# Patient Record
Sex: Female | Born: 1985 | Race: Black or African American | Hispanic: No | Marital: Single | Smoking: Current every day smoker
Health system: Southern US, Community
[De-identification: ages and names within clinical notes are randomized; demographics above are authoritative.]

## PROBLEM LIST (undated history)

## (undated) DIAGNOSIS — N750 Cyst of Bartholin's gland: Secondary | ICD-10-CM

## (undated) DIAGNOSIS — K219 Gastro-esophageal reflux disease without esophagitis: Secondary | ICD-10-CM

## (undated) HISTORY — PX: OTHER SURGICAL HISTORY: SHX169

## (undated) HISTORY — PX: CYSTECTOMY: SUR359

---
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
## 2006-12-13 ENCOUNTER — Ambulatory Visit (HOSPITAL_BASED_OUTPATIENT_CLINIC_OR_DEPARTMENT_OTHER): Admission: RE | Admit: 2006-12-13 | Discharge: 2006-12-13 | Payer: Self-pay | Admitting: Specialist

## 2006-12-13 ENCOUNTER — Encounter (INDEPENDENT_AMBULATORY_CARE_PROVIDER_SITE_OTHER): Payer: Self-pay | Admitting: Specialist

## 2009-02-12 ENCOUNTER — Emergency Department (HOSPITAL_COMMUNITY): Admission: EM | Admit: 2009-02-12 | Discharge: 2009-02-12 | Payer: Self-pay | Admitting: Emergency Medicine

## 2010-04-21 ENCOUNTER — Ambulatory Visit (HOSPITAL_BASED_OUTPATIENT_CLINIC_OR_DEPARTMENT_OTHER): Admission: RE | Admit: 2010-04-21 | Discharge: 2010-04-21 | Payer: Self-pay | Admitting: Specialist

## 2010-10-23 LAB — CBC
HCT: 34.2 % — ABNORMAL LOW (ref 36.0–46.0)
MCV: 87.7 fL (ref 78.0–100.0)
RBC: 3.9 MIL/uL (ref 3.87–5.11)
RDW: 13.7 % (ref 11.5–15.5)
WBC: 4.8 10*3/uL (ref 4.0–10.5)

## 2010-10-23 LAB — BASIC METABOLIC PANEL
BUN: 10 mg/dL (ref 6–23)
Chloride: 104 mEq/L (ref 96–112)
Potassium: 3.4 mEq/L — ABNORMAL LOW (ref 3.5–5.1)

## 2010-10-23 LAB — DIFFERENTIAL
Eosinophils Relative: 2 % (ref 0–5)
Lymphocytes Relative: 32 % (ref 12–46)
Lymphs Abs: 1.5 10*3/uL (ref 0.7–4.0)
Monocytes Absolute: 0.2 10*3/uL (ref 0.1–1.0)

## 2010-10-27 LAB — CBC: HCT: 37 % (ref 36–46)

## 2010-10-27 LAB — HIV ANTIBODY (ROUTINE TESTING W REFLEX): HIV: NONREACTIVE

## 2010-10-27 LAB — ABO/RH

## 2010-10-27 LAB — RUBELLA ANTIBODY, IGM: Rubella: IMMUNE

## 2010-12-26 NOTE — Op Note (Signed)
Nancy Swanson, Nancy Swanson                   ACCOUNT NO.:  [id number]   MEDICAL RECORD NO.:  [id number]          PATIENT TYPE:  AMB   LOCATION:  DSC                          FACILITY:  [facility]   PHYSICIAN:  Earvin Hansen L. Truesdale, M.D.DATE OF BIRTH:  1985-11-22   DATE OF PROCEDURE:  12/13/2006  DATE OF DISCHARGE:                               OPERATIVE REPORT   This is a 25 year old with a history of severe hidradenitis suppurativa  involving her left axillary area, had multiple I&Ds and conservative  treatment which failed.   PROCEDURES/PLAN:  Wide excision of this disorder, with closure with Estill Batten closure for flaps.   ANESTHESIA:  General.   The patient underwent general anesthesia, was intubated orally.  Prep  was done to the left axillary, arm, and chest areas with Betadine soap  and solution, and walled off with sterile towels and drapes so as to  make a sterile field.  Half-percent Xylocaine with epinephrine was  injected locally 1:200,000 concentration, a total of 50 mL.  Excision  was made of the area on an elliptical drawing previously done  preoperatively down to underlying superficial fascia.  After proper  hemostasis, the dissection was carried off en bloc to remove all of  devitalized tissue.  After proper hemostasis, the flaps were then  rotated and stayed with 2-0 Monocryl deep, subcutaneously and to fascia  x2 layers, a subdermal suture of 2-0 Monocryl, and then I ran a  subcuticular stitch of 3-0 Monocryl.  This was reinforced with a running  3-0 nylon.  Steri-Strips and soft dressing was applied to all the areas.  She withstood the procedure very well.   ESTIMATED BLOOD LOSS:  Less than 50 mL.   COMPLICATIONS:  None.      Nancy Swanson. Nancy Swanson, M.D.  Electronically Signed     GLT/MEDQ  D:  12/13/2006  T:  12/13/2006  Job:  [id number]

## 2010-12-26 NOTE — H&P (Signed)
[facility]  Patient:    Nancy Swanson, MEAS Visit Number: [id number] MRN: [id number]          Service Type: OBS Location: 910A 9130 01 Attending Physician:  Venita Sheffield Dictated by:   Kathreen Cosier, M.D. Admit Date:  06/13/2001                           History and Physical  HISTORY OF PRESENT ILLNESS:   Patient is a 25 year old primigravida, [facility] June 17, 2001, positive GBS.  She was admitted on the morning of November 4 with ruptured membranes which occurred spontaneously at 7 a.m.; fluid was clear.  Cervix was 1 cm, 90%, with the vertex -2 to -3.  She had been treated with ampicillin p.o. for positive GBS and on admission started a penicillin regimen.  The patient was allowed to go into labor, which she did not do, and she was started on Pitocin at 4 p.m. on November 4.  She had contractions two to five minutes apart of a mild nature.  Patient did not feel her contractions.  We went up on the Pitocin throughout the night and into the morning of November 5.  She was up to 32 milliunits of Pitocin, contracting every two to five minutes, very mild, the patient not feeling, no change in cervix.  It was decided she would be delivered by C section for prolonged ruptured membranes and failed induction of labor.  PHYSICAL EXAMINATION:  GENERAL:                      Revealed a well-developed female with ruptured membranes.  HEENT:                        Negative.  LUNGS:                        Clear.  HEART:                        Regular rhythm, no murmurs or gallops.  BREASTS:                      No masses.  ABDOMEN:                      Term.  Estimated fetal weight 7 pounds 6 ounces.  CERVIX:                       As described above.  EXTREMITIES:                  Negative. Dictated by:   Kathreen Cosier, M.D. Attending Physician:  Venita Sheffield DD:  06/14/01 TD:  06/14/01 Job: [id number]

## 2010-12-26 NOTE — Op Note (Signed)
[facility]  Patient:    Nancy Swanson, Nancy Swanson Visit Number: [id number] MRN: [id number]          Service Type: OBS Location: 910A 9130 01 Attending Physician:  Venita Sheffield Dictated by:   Kathreen Cosier, M.D. Proc. Date: 06/14/01 Admit Date:  06/13/2001                             Operative Report  PREOPERATIVE DIAGNOSES:       Prolonged ruptured membranes, failed induction of labor.  SURGEON:                      Kathreen Cosier, M.D.  ANESTHESIA:                   Spinal.  PROCEDURE:                    Patient placed on the operating table in supine position after spinal administered.  Abdomen prepped and draped.  Bladder emptied with Foley catheter.  A transverse suprapubic incision made, carried down to the rectus fascia.  Fascia cleaned and incised the length of the incision.  Recti muscles retracted laterally.  Perineum extended longitudinally.  Transverse incision made in the visceroperitoneum above the bladder.  Bladder mobilized inferior.  Transverse lower uterine incision made. The patient delivered from the LOT position of a female Apgar 9/9 weighing 7 pounds 3 ounces.  The team was in attendance and fluid was clear.  The placenta was posterior, removed manually.  Uterine cavity cleaned with dry laps.  Uterine incision closed in one layer with continuous suture of #1 Chromic.  The uterus was noted to be boggy and with massage and ______ 0.2 it firmed up.  Blood loss was about 800 cc.  The bladder flap reattached with 2-0 Chromic.  Uterus well contracted.  Tubes and ovaries normal.  Abdomen closed in layers.  Peritoneum continuous suture of 0 Chromic.  Fascia continuous suture of 0 Dexon.  Skin closed with subcuticular stitch of 0 plain.  Patient tolerated procedure well.  Taken to recovery room in good condition.Dictated by:   Kathreen Cosier, M.D. Attending Physician:  Venita Sheffield DD:  06/14/01 TD:   06/15/01 Job: [id number]

## 2010-12-26 NOTE — Discharge Summary (Signed)
[facility]  Patient:    Nancy Swanson, Nancy Swanson Visit Number: [id number] MRN: [id number]          Service Type: OBS Location: 910A 9130 01 Attending Physician:  Venita Sheffield Dictated by:   Kathreen Cosier, M.D. Admit Date:  06/13/2001 Discharge Date: 06/17/2001                             Discharge Summary  HISTORY OF PRESENT ILLNESS:   The patient is a 25 year old, primigravida, [facility] June 17, 2001.  She was admitted on June 13, 2001 with ruptured membranes, not in labor.  Cervix 1 cm, 90% vertex, -2, positive GBS treated with penicillin.  HOSPITAL COURSE:              The patient underwent Pitocin induction but did not go in labor and was delivered by a C-section having a female Apgar 9 of nine minutes, 7 pounds and 3 ounces.  On admission, her hemoglobin was 11.8 and postdelivery was 8.4  She was discharged home on the third postoperative day, ambulating, on a regular bath, and on Tylenol #3 for pain and Micronor for contraception and ferrous sulfate for anemia.  DISCHARGE DIAGNOSES:          1. Status post premature rupture at term.                               2. Failed induction of labor. Dictated by:   Kathreen Cosier, M.D. Attending Physician:  Venita Sheffield DD:  06/17/01 TD:  06/18/01 Job: [id number]

## 2010-12-28 ENCOUNTER — Inpatient Hospital Stay (HOSPITAL_COMMUNITY)
Admission: AD | Admit: 2010-12-28 | Discharge: 2010-12-28 | Disposition: A | Discharge status: Home / Self Care | Payer: Medicaid Other | Source: Ambulatory Visit | Attending: Obstetrics | Admitting: Obstetrics

## 2010-12-28 DIAGNOSIS — J329 Chronic sinusitis, unspecified: Secondary | ICD-10-CM | POA: Insufficient documentation

## 2010-12-28 DIAGNOSIS — O99891 Other specified diseases and conditions complicating pregnancy: Secondary | ICD-10-CM | POA: Insufficient documentation

## 2010-12-28 DIAGNOSIS — O9989 Other specified diseases and conditions complicating pregnancy, childbirth and the puerperium: Secondary | ICD-10-CM

## 2010-12-31 ENCOUNTER — Other Ambulatory Visit (HOSPITAL_COMMUNITY): Payer: Self-pay | Admitting: Obstetrics

## 2010-12-31 DIAGNOSIS — O2690 Pregnancy related conditions, unspecified, unspecified trimester: Secondary | ICD-10-CM

## 2011-01-12 ENCOUNTER — Ambulatory Visit (HOSPITAL_COMMUNITY)
Admission: RE | Admit: 2011-01-12 | Discharge: 2011-01-12 | Disposition: A | Discharge status: Home / Self Care | Payer: Medicaid Other | Source: Ambulatory Visit | Attending: Obstetrics | Admitting: Obstetrics

## 2011-01-12 DIAGNOSIS — Z1389 Encounter for screening for other disorder: Secondary | ICD-10-CM | POA: Insufficient documentation

## 2011-01-12 DIAGNOSIS — Z363 Encounter for antenatal screening for malformations: Secondary | ICD-10-CM | POA: Insufficient documentation

## 2011-01-12 DIAGNOSIS — O2690 Pregnancy related conditions, unspecified, unspecified trimester: Secondary | ICD-10-CM

## 2011-01-12 DIAGNOSIS — O34219 Maternal care for unspecified type scar from previous cesarean delivery: Secondary | ICD-10-CM | POA: Insufficient documentation

## 2011-01-12 DIAGNOSIS — O358XX0 Maternal care for other (suspected) fetal abnormality and damage, not applicable or unspecified: Secondary | ICD-10-CM | POA: Insufficient documentation

## 2011-04-11 ENCOUNTER — Encounter (HOSPITAL_COMMUNITY): Payer: Self-pay | Admitting: *Deleted

## 2011-04-11 ENCOUNTER — Inpatient Hospital Stay (HOSPITAL_COMMUNITY)
Admission: AD | Admit: 2011-04-11 | Discharge: 2011-04-14 | DRG: 781 | Disposition: A | Discharge status: Home / Self Care | Payer: Medicaid Other | Source: Ambulatory Visit | Attending: Obstetrics | Admitting: Obstetrics

## 2011-04-11 DIAGNOSIS — N762 Acute vulvitis: Secondary | ICD-10-CM

## 2011-04-11 DIAGNOSIS — N760 Acute vaginitis: Secondary | ICD-10-CM | POA: Diagnosis present

## 2011-04-11 DIAGNOSIS — Z3490 Encounter for supervision of normal pregnancy, unspecified, unspecified trimester: Secondary | ICD-10-CM

## 2011-04-11 DIAGNOSIS — O2390 Unspecified genitourinary tract infection in pregnancy, unspecified trimester: Principal | ICD-10-CM | POA: Diagnosis present

## 2011-04-11 NOTE — Progress Notes (Signed)
Pt states, " I had a cyst come up on my right labia on Wed-Thus, and I went to my clinic on Thurs. They said it wasn't ready to drain, and they put me on antibiotics. It has gotten worse since then."

## 2011-04-11 NOTE — Progress Notes (Signed)
G2P1 at 33.3wks. "boil" started on Weds and Thurs alittle worse. Called Dr Elsie Stain office and was told to call regular MD. Was seen at [facility] and was told not ready to lance. On Amoxicillin 250mg  TID since Friday. Pain has just gotten worse and more swollen.

## 2011-04-11 NOTE — ED Notes (Signed)
Terri Burrelson RNP at bedside, cyst examined.

## 2011-04-12 ENCOUNTER — Encounter (HOSPITAL_COMMUNITY): Payer: Self-pay | Admitting: *Deleted

## 2011-04-12 DIAGNOSIS — N762 Acute vulvitis: Secondary | ICD-10-CM

## 2011-04-12 DIAGNOSIS — Z3490 Encounter for supervision of normal pregnancy, unspecified, unspecified trimester: Secondary | ICD-10-CM

## 2011-04-12 MED ORDER — NICOTINE 14 MG/24HR TD PT24
14.0000 mg | MEDICATED_PATCH | Freq: Every day | TRANSDERMAL | Status: DC
  Administered 2011-04-12 – 2011-04-13 (×2): 14 mg via TRANSDERMAL
  Filled 2011-04-12 (×5): qty 1

## 2011-04-12 MED ORDER — LACTATED RINGERS IV SOLN
INTRAVENOUS | Status: DC
  Administered 2011-04-12 – 2011-04-14 (×6): via INTRAVENOUS

## 2011-04-12 MED ORDER — PRENATAL PLUS 27-1 MG PO TABS
1.0000 | ORAL_TABLET | Freq: Every day | ORAL | Status: DC
  Administered 2011-04-12 – 2011-04-13 (×2): 1 via ORAL
  Filled 2011-04-12 (×2): qty 1

## 2011-04-12 MED ORDER — DOCUSATE SODIUM 100 MG PO CAPS
100.0000 mg | ORAL_CAPSULE | Freq: Every day | ORAL | Status: DC
  Administered 2011-04-12 – 2011-04-13 (×2): 100 mg via ORAL
  Filled 2011-04-12 (×2): qty 1

## 2011-04-12 MED ORDER — CALCIUM CARBONATE ANTACID 500 MG PO CHEW: 2.0000 | CHEWABLE_TABLET | ORAL | Status: DC | PRN

## 2011-04-12 MED ORDER — SODIUM CHLORIDE 0.9 % IV SOLN
3.0000 g | Freq: Four times a day (QID) | INTRAVENOUS | Status: DC
  Administered 2011-04-12 – 2011-04-14 (×9): 3 g via INTRAVENOUS
  Filled 2011-04-12 (×11): qty 3

## 2011-04-12 MED ORDER — OXYCODONE-ACETAMINOPHEN 5-325 MG PO TABS
2.0000 | ORAL_TABLET | Freq: Once | ORAL | Status: AC
  Administered 2011-04-12: 2 via ORAL
  Filled 2011-04-12: qty 2

## 2011-04-12 MED ORDER — ACETAMINOPHEN 500 MG PO TABS
500.0000 mg | ORAL_TABLET | Freq: Four times a day (QID) | ORAL | Status: DC | PRN
  Administered 2011-04-13: 500 mg via ORAL
  Filled 2011-04-12: qty 1

## 2011-04-12 MED ORDER — ZOLPIDEM TARTRATE 10 MG PO TABS: 10.0000 mg | ORAL_TABLET | Freq: Every evening | ORAL | Status: DC | PRN

## 2011-04-12 MED ORDER — OXYCODONE-ACETAMINOPHEN 5-325 MG PO TABS
2.0000 | ORAL_TABLET | Freq: Four times a day (QID) | ORAL | Status: DC | PRN
  Administered 2011-04-12 (×2): 2 via ORAL
  Filled 2011-04-12 (×2): qty 2

## 2011-04-12 MED ORDER — PROMETHAZINE HCL 25 MG PO TABS
25.0000 mg | ORAL_TABLET | Freq: Four times a day (QID) | ORAL | Status: DC | PRN
  Administered 2011-04-12: 25 mg via ORAL
  Filled 2011-04-12: qty 1

## 2011-04-12 MED ORDER — SULFAMETHOXAZOLE-TMP DS 800-160 MG PO TABS
1.0000 | ORAL_TABLET | Freq: Two times a day (BID) | ORAL | Status: DC
  Administered 2011-04-12 – 2011-04-13 (×4): 1 via ORAL
  Filled 2011-04-12 (×5): qty 1

## 2011-04-12 NOTE — ED Provider Notes (Signed)
History     Chief Complaint  Patient presents with  . Bartholin's Cyst   HPI Nancy Swanson 25 y.o.  33w gestation comes for swollen labia.  Called Dr. Gaynell Face and was told to be seen by her PMD.  Was seen by [facility] and Prescribed Amoxicillin 250 mg TID.  Has been taking since Friday.  Has been using warm soaks to the area, but is getting worse, not better.  Is scheduled to go back to work on Monday.  Does not think she will be able to go to work due to pain.  Can hardly walk.     OB History    Grav Para Term Preterm Abortions TAB SAB Ect Mult Living   2 1 1  0 0 0 0 0 0 1      Past Medical History  Diagnosis Date  . No pertinent past medical history     Past Surgical History  Procedure Date  . Cesarean section   . Cystectomy      glands removed from bil axilla due to cyst formation    No family history on file.  History  Substance Use Topics  . Smoking status: Current Everyday Smoker  . Smokeless tobacco: Never Used  . Alcohol Use: No    Allergies: No Known Allergies  Prescriptions prior to admission  Medication Sig Dispense Refill  . acetaminophen (TYLENOL) 500 MG tablet Take 500 mg by mouth every 6 (six) hours as needed. For pain       . amoxicillin (AMOXIL) 250 MG capsule Take 250 mg by mouth 3 (three) times daily.        . prenatal vitamin w/FE, FA (PRENATAL 1 + 1) 27-1 MG TABS Take 1 tablet by mouth daily.          Review of Systems  Constitutional: Negative for fever.  Genitourinary:       Swollen right labia.    Physical Exam   Blood pressure 109/72, pulse 90, temperature 98.6 F (37 C), temperature source Oral, resp. rate 16, height 5' 4.75" (1.645 m), weight 184 lb 2 oz (83.519 kg).  Physical Exam  Nursing note and vitals reviewed. Constitutional: She is oriented to person, place, and time. She appears well-developed and well-nourished.  HENT:  Head: Normocephalic.  Eyes: EOM are normal.  Neck: Neck supple.  GI:       On fetal monitor.  Reactive  monitor strip.  Genitourinary:        Swollen and very tender right labia majora.  Very firm 8-9 cm by 5 cm.  One area approx 5 cm in diameter that is very edematous and red.  There is a soft spot less than 1 cm within the 5 cm area.  No active drainage.  Musculoskeletal: Normal range of motion.  Neurological: She is alert and oriented to person, place, and time.  Skin: Skin is warm and dry.  Psychiatric: She has a normal mood and affect.    MAU Course  Procedures  MDM Cellulitis which is not improving with oral antibiotics.  Not appropriate for lancing - cellulitis at present. Consult with Dr. Clearance Coots by phone.  Admission orders given.  Assessment and Plan  Cellulitis of labia majora.  Plan: Admission for IV antibiotics. BURLESON,TERRI 04/12/2011, 12:34 AM   Nolene Bernheim, NP 04/12/11 0113

## 2011-04-12 NOTE — H&P (Signed)
Nancy Swanson is a 25 y.o. female presenting for swelling and pain of vulva. Maternal Medical History:  Reason for admission: Pain and swelling of vulva.  Contractions: none  Fetal activity: Perceived fetal activity is normal.   Last perceived fetal movement was within the past hour.      OB History    Grav Para Term Preterm Abortions TAB SAB Ect Mult Living   2 1 1  0 0 0 0 0 0 1     Past Medical History  Diagnosis Date  . No pertinent past medical history    Past Surgical History  Procedure Date  . Cesarean section   . Cystectomy      glands removed from bil axilla due to cyst formation   Family History: family history is not on file. Social History:  reports that she has been smoking.  She has never used smokeless tobacco. She reports that she does not drink alcohol or use illicit drugs.  Review of Systems  Genitourinary:       Pain and swelling of vulva.  All other systems reviewed and are negative.      Blood pressure 100/68, pulse 73, temperature 98.1 F (36.7 C), temperature source Oral, resp. rate 16, height 5' 4.75" (1.645 m), weight 83.519 kg (184 lb 2 oz). Maternal Exam:  Uterine Assessment: No uterine contractions.  Abdomen: Patient reports no abdominal tenderness. Fetal presentation: vertex  Introitus: Vulva is positive for vulval edema. Normal vagina.    Physical Exam  Nursing note and vitals reviewed. Constitutional: She is oriented to person, place, and time. She appears well-developed and well-nourished.  HENT:  Head: Normocephalic and atraumatic.  Eyes: Conjunctivae are normal. Pupils are equal, round, and reactive to light.  Neck: Normal range of motion. Neck supple.  Cardiovascular: Normal rate and regular rhythm.   Respiratory: Effort normal.  GI: Soft.  Genitourinary: Vagina normal and uterus normal.       Swollen and tender vulva.  Musculoskeletal: Normal range of motion.  Neurological: She is alert and oriented to person, place, and  time. She has normal reflexes.  Skin: Skin is warm and dry.  Psychiatric: She has a normal mood and affect. Her behavior is normal. Judgment and thought content normal.    Prenatal labs: ABO, Rh:   Antibody:   Rubella:   RPR:    HBsAg:    HIV:    GBS:     Assessment/Plan: 33 weeks. Vulva cellulitis.  Admitted for IV antibiotics.   HARPER,CHARLES A 04/12/2011, 9:47 AM

## 2011-04-12 NOTE — Progress Notes (Signed)
Pt states feels better now.

## 2011-04-12 NOTE — ED Notes (Signed)
Report called to Susie RN -charge for Mother -Pecola Leisure and then April Coe RN who will be pt's nurse

## 2011-04-12 NOTE — Progress Notes (Signed)
OK to d/c EFM per Lilyan Punt NP. EFm reviewed by NP and Quintella Baton RNC

## 2011-04-12 NOTE — ED Notes (Signed)
To Rm 141 via w/c

## 2011-04-13 NOTE — Progress Notes (Signed)
  S: Preterm labor symptoms: none  O: Blood pressure 108/70, pulse 86, temperature 98.3 F (36.8 C), temperature source Oral, resp. rate 16, height 5' 4.75" (1.645 m), weight 83.519 kg (184 lb 2 oz).   ZOX:WRUEAVWU: 140 bpm Toco: None SVE:   A/P- 25 y.o. admitted with right labial abscess.  Severe pain, now much less as abscess is draining.  Continue IV antibiotics.  Dating:  [redacted]w[redacted]d PNL Needed:  none FWB:  good PTL:  none ROD: spontaneous vaginal

## 2011-04-14 NOTE — Discharge Summary (Signed)
  Patient [redacted] weeks pregnant was admitted on Saturday which has off an enlarged right labia Patient states that this mass is much smaller it was on admission and she's been leaking constantly On exam she has a small right labial abscess which is leaking On admission she was started on IV antibiotics Prior to admission she was on ampicillin 250 every 6 hours Patient with a discharge today on the same medication that she was on prior to admission And she is to see me in 2 weeks

## 2011-04-14 NOTE — Progress Notes (Signed)
UR Chart review completed.  

## 2011-04-28 LAB — STREP B DNA PROBE: GBS: NEGATIVE

## 2011-05-22 ENCOUNTER — Encounter (HOSPITAL_COMMUNITY): Payer: Self-pay | Admitting: *Deleted

## 2011-05-22 ENCOUNTER — Inpatient Hospital Stay (HOSPITAL_COMMUNITY)
Admission: AD | Admit: 2011-05-22 | Discharge: 2011-05-26 | DRG: 766 | Disposition: A | Discharge status: Home / Self Care | Payer: Medicaid Other | Source: Ambulatory Visit | Attending: Obstetrics | Admitting: Obstetrics

## 2011-05-22 DIAGNOSIS — N762 Acute vulvitis: Secondary | ICD-10-CM

## 2011-05-22 DIAGNOSIS — O4290 Premature rupture of membranes, unspecified as to length of time between rupture and onset of labor, unspecified weeks of gestation: Principal | ICD-10-CM | POA: Diagnosis present

## 2011-05-22 DIAGNOSIS — O34219 Maternal care for unspecified type scar from previous cesarean delivery: Secondary | ICD-10-CM | POA: Diagnosis present

## 2011-05-22 HISTORY — DX: Cyst of Bartholin's gland: N75.0

## 2011-05-22 HISTORY — DX: Gastro-esophageal reflux disease without esophagitis: K21.9

## 2011-05-22 LAB — CBC
HCT: 38.4 % (ref 36.0–46.0)
Hemoglobin: 13.3 g/dL (ref 12.0–15.0)
MCHC: 34.6 g/dL (ref 30.0–36.0)
MCV: 89.5 fL (ref 78.0–100.0)

## 2011-05-22 LAB — POCT FERN TEST: Fern Test: POSITIVE

## 2011-05-22 MED ORDER — IBUPROFEN 600 MG PO TABS: 600.0000 mg | ORAL_TABLET | Freq: Four times a day (QID) | ORAL | Status: DC | PRN

## 2011-05-22 MED ORDER — LACTATED RINGERS IV SOLN: 500.0000 mL | INTRAVENOUS | Status: DC | PRN

## 2011-05-22 MED ORDER — ONDANSETRON HCL 4 MG/2ML IJ SOLN: 4.0000 mg | Freq: Four times a day (QID) | INTRAMUSCULAR | Status: DC | PRN

## 2011-05-22 MED ORDER — LACTATED RINGERS IV SOLN
INTRAVENOUS | Status: DC
  Administered 2011-05-22 – 2011-05-23 (×2): via INTRAVENOUS

## 2011-05-22 MED ORDER — PENICILLIN G POTASSIUM 5000000 UNITS IJ SOLR
5.0000 10*6.[IU] | Freq: Once | INTRAVENOUS | Status: DC
  Filled 2011-05-22: qty 5

## 2011-05-22 MED ORDER — PENICILLIN G POTASSIUM 5000000 UNITS IJ SOLR
2.5000 10*6.[IU] | INTRAVENOUS | Status: DC
  Filled 2011-05-22 (×3): qty 2.5

## 2011-05-22 MED ORDER — CITRIC ACID-SODIUM CITRATE 334-500 MG/5ML PO SOLN
30.0000 mL | ORAL | Status: DC | PRN
  Administered 2011-05-23: 30 mL via ORAL
  Filled 2011-05-22: qty 15

## 2011-05-22 MED ORDER — OXYTOCIN BOLUS FROM INFUSION
500.0000 mL | Freq: Once | INTRAVENOUS | Status: DC
  Filled 2011-05-22: qty 500

## 2011-05-22 MED ORDER — HYDROXYZINE HCL 50 MG PO TABS: 50.0000 mg | ORAL_TABLET | Freq: Four times a day (QID) | ORAL | Status: DC | PRN

## 2011-05-22 MED ORDER — LIDOCAINE HCL (PF) 1 % IJ SOLN
30.0000 mL | INTRAMUSCULAR | Status: DC | PRN
  Filled 2011-05-22: qty 30

## 2011-05-22 MED ORDER — ONDANSETRON HCL 4 MG/2ML IJ SOLN
4.0000 mg | Freq: Four times a day (QID) | INTRAMUSCULAR | Status: DC | PRN
  Administered 2011-05-22: 4 mg via INTRAVENOUS
  Filled 2011-05-22: qty 2

## 2011-05-22 MED ORDER — CLINDAMYCIN PHOSPHATE 900 MG/50ML IV SOLN
900.0000 mg | Freq: Three times a day (TID) | INTRAVENOUS | Status: DC
  Administered 2011-05-22 – 2011-05-23 (×2): 900 mg via INTRAVENOUS
  Filled 2011-05-22 (×4): qty 50

## 2011-05-22 MED ORDER — ZOLPIDEM TARTRATE 10 MG PO TABS: 10.0000 mg | ORAL_TABLET | Freq: Every evening | ORAL | Status: DC | PRN

## 2011-05-22 MED ORDER — FLEET ENEMA 7-19 GM/118ML RE ENEM: 1.0000 | ENEMA | RECTAL | Status: DC | PRN

## 2011-05-22 MED ORDER — LIDOCAINE HCL (PF) 1 % IJ SOLN: 30.0000 mL | INTRAMUSCULAR | Status: DC | PRN

## 2011-05-22 MED ORDER — OXYTOCIN 20 UNITS IN LACTATED RINGERS INFUSION - SIMPLE
1.0000 m[IU]/min | INTRAVENOUS | Status: DC
  Administered 2011-05-22: 2 m[IU]/min via INTRAVENOUS
  Filled 2011-05-22: qty 1000

## 2011-05-22 MED ORDER — OXYCODONE-ACETAMINOPHEN 5-325 MG PO TABS: 2.0000 | ORAL_TABLET | ORAL | Status: DC | PRN

## 2011-05-22 MED ORDER — BUTORPHANOL TARTRATE 2 MG/ML IJ SOLN
1.0000 mg | INTRAMUSCULAR | Status: DC | PRN
  Administered 2011-05-22: 1 mg via INTRAVENOUS
  Filled 2011-05-22: qty 1

## 2011-05-22 MED ORDER — CITRIC ACID-SODIUM CITRATE 334-500 MG/5ML PO SOLN: 30.0000 mL | ORAL | Status: DC | PRN

## 2011-05-22 MED ORDER — OXYTOCIN 20 UNITS IN LACTATED RINGERS INFUSION - SIMPLE: 125.0000 mL/h | Freq: Once | INTRAVENOUS | Status: DC

## 2011-05-22 MED ORDER — TERBUTALINE SULFATE 1 MG/ML IJ SOLN: 0.2500 mg | Freq: Once | INTRAMUSCULAR | Status: AC | PRN

## 2011-05-22 MED ORDER — ACETAMINOPHEN 325 MG PO TABS: 650.0000 mg | ORAL_TABLET | ORAL | Status: DC | PRN

## 2011-05-22 MED ORDER — HYDROXYZINE HCL 50 MG/ML IM SOLN: 50.0000 mg | Freq: Four times a day (QID) | INTRAMUSCULAR | Status: DC | PRN

## 2011-05-22 MED ORDER — OXYTOCIN 20 UNITS IN LACTATED RINGERS INFUSION - SIMPLE: 1.0000 m[IU]/min | INTRAVENOUS | Status: DC

## 2011-05-22 MED ORDER — LACTATED RINGERS IV SOLN: INTRAVENOUS | Status: DC

## 2011-05-22 MED ORDER — ZOLPIDEM TARTRATE 10 MG PO TABS
10.0000 mg | ORAL_TABLET | Freq: Every evening | ORAL | Status: DC | PRN
  Administered 2011-05-23: 10 mg via ORAL
  Filled 2011-05-22: qty 1

## 2011-05-22 MED ORDER — BUTORPHANOL TARTRATE 2 MG/ML IJ SOLN: 1.0000 mg | INTRAMUSCULAR | Status: DC | PRN

## 2011-05-22 NOTE — Progress Notes (Signed)
Dr. Tamela Oddi notified of pt status and order clarification and correction for dose of pitocin to be increased.

## 2011-05-22 NOTE — ED Notes (Signed)
No active leaking at this time. Swab from introitus placed on slide for fern test. Very small amount of discharge noted. White, thin.

## 2011-05-22 NOTE — Progress Notes (Signed)
I was asked to do a speculum exam to r/o SROM. Fern test was positive. Cervix 1/50/-3.  Clinton Gallant. Rice III, DrHSc, MPAS, PA-C

## 2011-05-22 NOTE — H&P (Signed)
Nancy Swanson is a 25 y.o. female presenting for SROM. Maternal Medical History:  Reason for admission: Reason for admission: rupture of membranes.  Reason for Admission:   nauseaFetal activity: Perceived fetal activity is normal.    Prenatal complications: no prenatal complications   OB History    Grav Para Term Preterm Abortions TAB SAB Ect Mult Living   2 1 1  0 0 0 0 0 0 1     Past Medical History  Diagnosis Date  . Acid reflux   . Bartholin cyst    Past Surgical History  Procedure Date  . Cesarean section   . Cystectomy      glands removed from bil axilla due to cyst formation  . Sweat glands     removed from under arms   Family History: family history is not on file. Social History:  reports that she has been smoking.  She has never used smokeless tobacco. She reports that she does not drink alcohol or use illicit drugs.  Review of Systems  Constitutional: Negative for fever.  Eyes: Negative for blurred vision.  Respiratory: Negative for shortness of breath.   Gastrointestinal: Negative for nausea and vomiting.  Skin: Negative for rash.  Neurological: Negative for headaches.    Dilation: 1 Effacement (%): 50 Station: -3 Exam by:: E. Rice, PA Blood pressure 114/74, pulse 88, temperature 98.1 F (36.7 C), temperature source Oral, resp. rate 16, height 5\' 4"  (1.626 m), weight 82.101 kg (181 lb). Maternal Exam:  Abdomen: Patient reports no abdominal tenderness. Surgical scars: low transverse.   Fetal presentation: vertex  Introitus: not evaluated.     Fetal Exam Fetal Monitor Review: Variability: moderate (6-25 bpm).   Pattern: accelerations present and no decelerations.    Fetal State Assessment: Category I - tracings are normal.     Physical Exam  Constitutional: She appears well-developed.  HENT:  Head: Normocephalic.  Neck: Neck supple. No thyromegaly present.  Cardiovascular: Normal rate and regular rhythm.   Respiratory: Breath sounds normal.    GI: Soft. Bowel sounds are normal.  Skin: No rash noted.    Prenatal labs: ABO, Rh: O/Positive/-- (03/19 0000) Antibody: Negative (03/19 0000) Rubella: Immune (03/19 0000) RPR: Nonreactive (03/19 0000)  HBsAg: Negative (03/19 0000)  HIV: Non-reactive (03/19 0000)  GBS: Negative (09/18 0000)   Assessment/Plan:  The patient is a 25 y.o.primipara  with an IUP @[redacted]w[redacted]d  w/PROM. There is a history of a previous cesarean delivery. The patient is a candidate for a TOLAC.  Admit Clindamycin parenterally Augmentation of labor with low-dose Pitocin per protocol  JACKSON-MOORE,Roselin Wiemann A 05/22/2011, 3:42 PM

## 2011-05-22 NOTE — Progress Notes (Signed)
Last night, noticed some leaking of fluid around 2300. Then noticed again around 0500 and 1230. Not continuous. States is thin and clear, enough to come out onto panties. Called MD office and was advised to come to hospital.

## 2011-05-22 NOTE — Progress Notes (Signed)
Spoke with Dr. Tamela Oddi about pt request for repeat c/s.  MD stated that she would do c/s in am & to have anesthesia come to see pt.

## 2011-05-22 NOTE — Progress Notes (Signed)
Pt sleeping. 

## 2011-05-22 NOTE — Progress Notes (Signed)
RN in to adjust monitor and pt stated that she does not wish to proceed with induction any longer and would prefer to have a repeat c/s

## 2011-05-23 ENCOUNTER — Other Ambulatory Visit: Payer: Self-pay | Admitting: Obstetrics & Gynecology

## 2011-05-23 ENCOUNTER — Encounter (HOSPITAL_COMMUNITY): Payer: Self-pay | Admitting: Anesthesiology

## 2011-05-23 ENCOUNTER — Encounter (HOSPITAL_COMMUNITY)
Admission: AD | Disposition: A | Discharge status: Home / Self Care | Payer: Self-pay | Source: Ambulatory Visit | Attending: Obstetrics

## 2011-05-23 ENCOUNTER — Inpatient Hospital Stay (HOSPITAL_COMMUNITY): Payer: Medicaid Other | Admitting: Anesthesiology

## 2011-05-23 DIAGNOSIS — O34219 Maternal care for unspecified type scar from previous cesarean delivery: Secondary | ICD-10-CM | POA: Diagnosis present

## 2011-05-23 LAB — ABO/RH: ABO/RH(D): O POS

## 2011-05-23 SURGERY — Surgical Case
Anesthesia: Regional | Site: Abdomen | Wound class: Clean Contaminated

## 2011-05-23 MED ORDER — FERROUS SULFATE 325 (65 FE) MG PO TABS
325.0000 mg | ORAL_TABLET | Freq: Two times a day (BID) | ORAL | Status: DC
  Administered 2011-05-23 – 2011-05-26 (×5): 325 mg via ORAL
  Filled 2011-05-23 (×5): qty 1

## 2011-05-23 MED ORDER — LACTATED RINGERS IV SOLN: INTRAVENOUS | Status: DC

## 2011-05-23 MED ORDER — PHENYLEPHRINE HCL 10 MG/ML IJ SOLN
INTRAMUSCULAR | Status: DC | PRN
  Administered 2011-05-23: 120 ug via INTRAVENOUS
  Administered 2011-05-23 (×2): 80 ug via INTRAVENOUS

## 2011-05-23 MED ORDER — OXYTOCIN 10 UNIT/ML IJ SOLN
20.0000 [IU] | INTRAVENOUS | Status: DC | PRN
  Administered 2011-05-23: 20 [IU] via INTRAVENOUS

## 2011-05-23 MED ORDER — ONDANSETRON HCL 4 MG/2ML IJ SOLN
INTRAMUSCULAR | Status: AC
  Filled 2011-05-23: qty 2

## 2011-05-23 MED ORDER — IBUPROFEN 600 MG PO TABS
600.0000 mg | ORAL_TABLET | Freq: Four times a day (QID) | ORAL | Status: DC
  Administered 2011-05-23 – 2011-05-26 (×12): 600 mg via ORAL
  Filled 2011-05-23 (×8): qty 1

## 2011-05-23 MED ORDER — MORPHINE SULFATE (PF) 0.5 MG/ML IJ SOLN
INTRAMUSCULAR | Status: DC | PRN
  Administered 2011-05-23: 1 mg via INTRAVENOUS
  Administered 2011-05-23: .85 mg via INTRAVENOUS
  Administered 2011-05-23: 1 mg via INTRAVENOUS
  Administered 2011-05-23: 1 mg via EPIDURAL
  Administered 2011-05-23: 1 mg via INTRAVENOUS

## 2011-05-23 MED ORDER — FENTANYL CITRATE 0.05 MG/ML IJ SOLN
INTRAMUSCULAR | Status: DC | PRN
  Administered 2011-05-23: 75 ug via INTRAVENOUS

## 2011-05-23 MED ORDER — KETOROLAC TROMETHAMINE 60 MG/2ML IM SOLN
60.0000 mg | Freq: Once | INTRAMUSCULAR | Status: AC | PRN
  Administered 2011-05-23: 60 mg via INTRAMUSCULAR

## 2011-05-23 MED ORDER — BUPIVACAINE IN DEXTROSE 0.75-8.25 % IT SOLN
INTRATHECAL | Status: DC | PRN
  Administered 2011-05-23: 1.6 mL via INTRATHECAL

## 2011-05-23 MED ORDER — KETOROLAC TROMETHAMINE 60 MG/2ML IM SOLN
INTRAMUSCULAR | Status: AC
  Administered 2011-05-23: 60 mg via INTRAMUSCULAR
  Filled 2011-05-23: qty 2

## 2011-05-23 MED ORDER — SODIUM CHLORIDE 0.9 % IR SOLN
Status: DC | PRN
  Administered 2011-05-23: 1000 mL

## 2011-05-23 MED ORDER — MAGNESIUM HYDROXIDE 400 MG/5ML PO SUSP: 30.0000 mL | ORAL | Status: DC | PRN

## 2011-05-23 MED ORDER — METOCLOPRAMIDE HCL 5 MG/ML IJ SOLN
10.0000 mg | Freq: Once | INTRAMUSCULAR | Status: AC | PRN
  Administered 2011-05-23: 10 mg via INTRAVENOUS

## 2011-05-23 MED ORDER — DIBUCAINE 1 % RE OINT: 1.0000 "application " | TOPICAL_OINTMENT | RECTAL | Status: DC | PRN

## 2011-05-23 MED ORDER — ONDANSETRON HCL 4 MG/2ML IJ SOLN: 4.0000 mg | INTRAMUSCULAR | Status: DC | PRN

## 2011-05-23 MED ORDER — OXYTOCIN 10 UNIT/ML IJ SOLN
INTRAMUSCULAR | Status: AC
  Filled 2011-05-23: qty 4

## 2011-05-23 MED ORDER — SIMETHICONE 80 MG PO CHEW
80.0000 mg | CHEWABLE_TABLET | ORAL | Status: DC | PRN
  Administered 2011-05-23 – 2011-05-25 (×5): 80 mg via ORAL

## 2011-05-23 MED ORDER — TETANUS-DIPHTH-ACELL PERTUSSIS 5-2.5-18.5 LF-MCG/0.5 IM SUSP: 0.5000 mL | Freq: Once | INTRAMUSCULAR | Status: DC

## 2011-05-23 MED ORDER — SENNOSIDES-DOCUSATE SODIUM 8.6-50 MG PO TABS
2.0000 | ORAL_TABLET | Freq: Every day | ORAL | Status: DC
  Administered 2011-05-23 – 2011-05-25 (×3): 2 via ORAL

## 2011-05-23 MED ORDER — ONDANSETRON HCL 4 MG/2ML IJ SOLN
INTRAMUSCULAR | Status: DC | PRN
  Administered 2011-05-23: 4 mg via INTRAVENOUS

## 2011-05-23 MED ORDER — MEPERIDINE HCL 25 MG/ML IJ SOLN: 6.2500 mg | INTRAMUSCULAR | Status: DC | PRN

## 2011-05-23 MED ORDER — FENTANYL CITRATE 0.05 MG/ML IJ SOLN: 25.0000 ug | INTRAMUSCULAR | Status: DC | PRN

## 2011-05-23 MED ORDER — WITCH HAZEL-GLYCERIN EX PADS: 1.0000 "application " | MEDICATED_PAD | CUTANEOUS | Status: DC | PRN

## 2011-05-23 MED ORDER — DIPHENHYDRAMINE HCL 25 MG PO CAPS: 25.0000 mg | ORAL_CAPSULE | Freq: Four times a day (QID) | ORAL | Status: DC | PRN

## 2011-05-23 MED ORDER — ZOLPIDEM TARTRATE 5 MG PO TABS: 5.0000 mg | ORAL_TABLET | Freq: Every evening | ORAL | Status: DC | PRN

## 2011-05-23 MED ORDER — OXYTOCIN 20 UNITS IN LACTATED RINGERS INFUSION - SIMPLE
125.0000 mL/h | INTRAVENOUS | Status: AC
  Administered 2011-05-23: 125 mL/h via INTRAVENOUS

## 2011-05-23 MED ORDER — SCOPOLAMINE 1 MG/3DAYS TD PT72
MEDICATED_PATCH | TRANSDERMAL | Status: AC
  Filled 2011-05-23: qty 1

## 2011-05-23 MED ORDER — MEDROXYPROGESTERONE ACETATE 150 MG/ML IM SUSP
150.0000 mg | INTRAMUSCULAR | Status: AC | PRN
  Administered 2011-05-26: 150 mg via INTRAMUSCULAR
  Filled 2011-05-23: qty 1

## 2011-05-23 MED ORDER — LACTATED RINGERS IV SOLN
INTRAVENOUS | Status: DC | PRN
  Administered 2011-05-23 (×3): via INTRAVENOUS

## 2011-05-23 MED ORDER — ONDANSETRON HCL 4 MG PO TABS: 4.0000 mg | ORAL_TABLET | ORAL | Status: DC | PRN

## 2011-05-23 MED ORDER — OXYCODONE-ACETAMINOPHEN 5-325 MG PO TABS
1.0000 | ORAL_TABLET | ORAL | Status: DC | PRN
  Administered 2011-05-24: 2 via ORAL
  Administered 2011-05-24: 1 via ORAL
  Administered 2011-05-24 (×2): 2 via ORAL
  Administered 2011-05-25 (×3): 1 via ORAL
  Administered 2011-05-25: 2 via ORAL
  Administered 2011-05-26: 1 via ORAL
  Administered 2011-05-26: 2 via ORAL
  Administered 2011-05-26: 1 via ORAL
  Filled 2011-05-23 (×2): qty 2
  Filled 2011-05-23 (×3): qty 1
  Filled 2011-05-23: qty 2
  Filled 2011-05-23 (×3): qty 1
  Filled 2011-05-23 (×2): qty 2

## 2011-05-23 MED ORDER — PRENATAL PLUS 27-1 MG PO TABS
1.0000 | ORAL_TABLET | Freq: Every day | ORAL | Status: DC
  Administered 2011-05-24 – 2011-05-25 (×2): 1 via ORAL
  Filled 2011-05-23 (×2): qty 1

## 2011-05-23 MED ORDER — FENTANYL CITRATE 0.05 MG/ML IJ SOLN
INTRAMUSCULAR | Status: AC
  Filled 2011-05-23: qty 2

## 2011-05-23 MED ORDER — EPHEDRINE SULFATE 50 MG/ML IJ SOLN
INTRAMUSCULAR | Status: DC | PRN
  Administered 2011-05-23 (×4): 10 mg via INTRAVENOUS
  Administered 2011-05-23: 5 mg via INTRAVENOUS
  Administered 2011-05-23: 20 mg via INTRAVENOUS

## 2011-05-23 MED ORDER — EPHEDRINE 5 MG/ML INJ
INTRAVENOUS | Status: AC
  Filled 2011-05-23: qty 20

## 2011-05-23 MED ORDER — SCOPOLAMINE 1 MG/3DAYS TD PT72
1.0000 | MEDICATED_PATCH | Freq: Once | TRANSDERMAL | Status: DC
  Administered 2011-05-23: 1.5 mg via TRANSDERMAL

## 2011-05-23 MED ORDER — MORPHINE SULFATE 0.5 MG/ML IJ SOLN
INTRAMUSCULAR | Status: AC
  Filled 2011-05-23: qty 10

## 2011-05-23 MED ORDER — OXYTOCIN 20 UNITS IN LACTATED RINGERS INFUSION - SIMPLE
INTRAVENOUS | Status: AC
  Administered 2011-05-23: 125 mL/h via INTRAVENOUS
  Filled 2011-05-23: qty 1000

## 2011-05-23 MED ORDER — METOCLOPRAMIDE HCL 5 MG/ML IJ SOLN
INTRAMUSCULAR | Status: AC
  Administered 2011-05-23: 10 mg via INTRAVENOUS
  Filled 2011-05-23: qty 2

## 2011-05-23 MED ORDER — MORPHINE SULFATE (PF) 0.5 MG/ML IJ SOLN
INTRAMUSCULAR | Status: DC | PRN
  Administered 2011-05-23: .15 mg via EPIDURAL

## 2011-05-23 MED ORDER — LANOLIN HYDROUS EX OINT: 1.0000 "application " | TOPICAL_OINTMENT | CUTANEOUS | Status: DC | PRN

## 2011-05-23 MED ORDER — FENTANYL CITRATE 0.05 MG/ML IJ SOLN
INTRAMUSCULAR | Status: DC | PRN
  Administered 2011-05-23: 25 ug via INTRATHECAL

## 2011-05-23 MED ORDER — PHENYLEPHRINE 40 MCG/ML (10ML) SYRINGE FOR IV PUSH (FOR BLOOD PRESSURE SUPPORT)
PREFILLED_SYRINGE | INTRAVENOUS | Status: AC
  Filled 2011-05-23: qty 10

## 2011-05-23 MED ORDER — MEASLES, MUMPS & RUBELLA VAC ~~LOC~~ INJ
0.5000 mL | INJECTION | Freq: Once | SUBCUTANEOUS | Status: DC
  Filled 2011-05-23: qty 0.5

## 2011-05-23 SURGICAL SUPPLY — 48 items
BENZOIN TINCTURE PRP APPL 2/3 (GAUZE/BANDAGES/DRESSINGS) ×2 IMPLANT
CANISTER WOUND CARE 500ML ATS (WOUND CARE) IMPLANT
CHLORAPREP W/TINT 26ML (MISCELLANEOUS) ×2 IMPLANT
CLOSURE STERI STRIP 1/2 X4 (GAUZE/BANDAGES/DRESSINGS) ×2 IMPLANT
CLOTH BEACON ORANGE TIMEOUT ST (SAFETY) ×2 IMPLANT
CONTAINER PREFILL 10% NBF 15ML (MISCELLANEOUS) IMPLANT
DERMABOND ADVANCED (GAUZE/BANDAGES/DRESSINGS) ×1
DERMABOND ADVANCED .7 DNX12 (GAUZE/BANDAGES/DRESSINGS) ×1 IMPLANT
DRESSING TELFA 8X3 (GAUZE/BANDAGES/DRESSINGS) ×2 IMPLANT
DRSG COVADERM 4X10 (GAUZE/BANDAGES/DRESSINGS) ×2 IMPLANT
DRSG PAD ABDOMINAL 8X10 ST (GAUZE/BANDAGES/DRESSINGS) ×2 IMPLANT
DRSG VAC ATS LRG SENSATRAC (GAUZE/BANDAGES/DRESSINGS) IMPLANT
DRSG VAC ATS MED SENSATRAC (GAUZE/BANDAGES/DRESSINGS) IMPLANT
DRSG VAC ATS SM SENSATRAC (GAUZE/BANDAGES/DRESSINGS) IMPLANT
ELECT REM PT RETURN 9FT ADLT (ELECTROSURGICAL) ×2
ELECTRODE REM PT RTRN 9FT ADLT (ELECTROSURGICAL) ×1 IMPLANT
EXTRACTOR VACUUM M CUP 4 TUBE (SUCTIONS) IMPLANT
GAUZE SPONGE 4X4 12PLY STRL LF (GAUZE/BANDAGES/DRESSINGS) IMPLANT
GLOVE BIO SURGEON STRL SZ 6.5 (GLOVE) ×4 IMPLANT
GOWN PREVENTION PLUS LG XLONG (DISPOSABLE) ×4 IMPLANT
KIT ABG SYR 3ML LUER SLIP (SYRINGE) IMPLANT
NEEDLE HYPO 25X5/8 SAFETYGLIDE (NEEDLE) IMPLANT
NS IRRIG 1000ML POUR BTL (IV SOLUTION) ×2 IMPLANT
PACK C SECTION WH (CUSTOM PROCEDURE TRAY) ×2 IMPLANT
PAD ABD 7.5X8 STRL (GAUZE/BANDAGES/DRESSINGS) ×2 IMPLANT
RTRCTR C-SECT PINK 25CM LRG (MISCELLANEOUS) IMPLANT
RTRCTR C-SECT PINK 34CM XLRG (MISCELLANEOUS) IMPLANT
SLEEVE SCD COMPRESS KNEE MED (MISCELLANEOUS) ×2 IMPLANT
STAPLER VISISTAT 35W (STAPLE) IMPLANT
SUT MNCRL 0 VIOLET CTX 36 (SUTURE) ×2 IMPLANT
SUT MNCRL AB 3-0 PS2 27 (SUTURE) IMPLANT
SUT MON AB 2-0 CT1 27 (SUTURE) ×2 IMPLANT
SUT MON AB 2-0 SH 27 (SUTURE) ×1
SUT MON AB 2-0 SH27 (SUTURE) ×1 IMPLANT
SUT MONOCRYL 0 CTX 36 (SUTURE) ×2
SUT PDS AB 0 CTX 36 PDP370T (SUTURE) ×4 IMPLANT
SUT PLAIN 0 NONE (SUTURE) IMPLANT
SUT VIC AB 0 CT1 27 (SUTURE)
SUT VIC AB 0 CT1 27XBRD ANBCTR (SUTURE) IMPLANT
SUT VIC AB 2-0 CT1 (SUTURE) IMPLANT
SUT VIC AB 2-0 CT1 27 (SUTURE) ×1
SUT VIC AB 2-0 CT1 TAPERPNT 27 (SUTURE) ×1 IMPLANT
SUT VIC AB 2-0 SH 27 (SUTURE)
SUT VIC AB 2-0 SH 27XBRD (SUTURE) IMPLANT
TAPE CLOTH SURG 4X10 WHT LF (GAUZE/BANDAGES/DRESSINGS) ×2 IMPLANT
TOWEL OR 17X24 6PK STRL BLUE (TOWEL DISPOSABLE) ×4 IMPLANT
TRAY FOLEY CATH 14FR (SET/KITS/TRAYS/PACK) ×2 IMPLANT
WATER STERILE IRR 1000ML POUR (IV SOLUTION) ×2 IMPLANT

## 2011-05-23 NOTE — Anesthesia Preprocedure Evaluation (Signed)
Anesthesia Evaluation  Name, MR# and DOB Patient awake  General Assessment Comment  Reviewed: Allergy & Precautions, H&P , NPO status , Patient's Chart, lab work & pertinent test results  History of Anesthesia Complications (+) PONV  Airway Mallampati: II TM Distance: >3 FB Neck ROM: Full    Dental No notable dental hx. (+) Teeth Intact   Pulmonary  clear to auscultation  Pulmonary exam normal       Cardiovascular Regular Normal    Neuro/Psych Negative Neurological ROS  Negative Psych ROS   GI/Hepatic negative GI ROS Neg liver ROS    Endo/Other  Negative Endocrine ROS  Renal/GU negative Renal ROS  Genitourinary negative   Musculoskeletal   Abdominal   Peds  Hematology negative hematology ROS (+)   Anesthesia Other Findings   Reproductive/Obstetrics (+) Pregnancy                           Anesthesia Physical Anesthesia Plan  ASA: II and Emergent  Anesthesia Plan: Spinal   Post-op Pain Management:    Induction:   Airway Management Planned:   Additional Equipment:   Intra-op Plan:   Post-operative Plan:   Informed Consent: I have reviewed the patients History and Physical, chart, labs and discussed the procedure including the risks, benefits and alternatives for the proposed anesthesia with the patient or authorized representative who has indicated his/her understanding and acceptance.     Plan Discussed with: Anesthesiologist  Anesthesia Plan Comments:         Anesthesia Quick Evaluation

## 2011-05-23 NOTE — Anesthesia Postprocedure Evaluation (Signed)
  Anesthesia Post-op Note  Patient: Nancy Swanson  Procedure(s) Performed:  CESAREAN SECTION - Repeat cesarean section with delivery of baby boy at 45. Apgars 9/9.  Patient Location: PACU  Anesthesia Type: Spinal  Level of Consciousness: awake, alert  and oriented  Airway and Oxygen Therapy: Patient Spontanous Breathing  Post-op Pain: none  Post-op Assessment: Post-op Vital signs reviewed, Patient's Cardiovascular Status Stable, Respiratory Function Stable, Patent Airway, Pain level controlled, No headache, No backache, No residual numbness and No residual motor weakness  Post-op Vital Signs: Reviewed and stable  Complications: No apparent anesthesia complications

## 2011-05-23 NOTE — OR Nursing (Signed)
Uterus massaged by S. Anamarie Hunn RN.  Two tubes of cord blood to lab.  

## 2011-05-23 NOTE — Transfer of Care (Signed)
Immediate Anesthesia Transfer of Care Note  Patient: Nancy Swanson  Procedure(s) Performed:  CESAREAN SECTION - Repeat cesarean section with delivery of baby boy at 46. Apgars 9/9.  Patient Location: PACU  Anesthesia Type: Spinal  Level of Consciousness: awake, alert  and oriented  Airway & Oxygen Therapy: Patient Spontanous Breathing  Post-op Assessment: Report given to PACU RN and Post -op Vital signs reviewed and stable  Post vital signs: stable  Complications: No apparent anesthesia complications

## 2011-05-23 NOTE — Anesthesia Procedure Notes (Addendum)
Spinal Block  Patient location during procedure: OR Start time: 05/23/2011 8:07 AM Staffing Anesthesiologist: Eron Staat A. Performed by: anesthesiologist  Preanesthetic Checklist Completed: patient identified, site marked, surgical consent, pre-op evaluation, timeout performed, IV checked, risks and benefits discussed and monitors and equipment checked Spinal Block Patient position: sitting Prep: site prepped and draped and DuraPrep Patient monitoring: heart rate, cardiac monitor, continuous pulse ox and blood pressure Approach: midline Location: L3-4 Injection technique: single-shot Needle Needle type: Sprotte  Needle gauge: 24 G Needle length: 9 cm Needle insertion depth: 5 cm Assessment Sensory level: T4 Additional Notes Patient tolerated procedure well. Adequate surgical anesthetic level.

## 2011-05-23 NOTE — Op Note (Signed)
Cesarean Section Procedure Note   Nancy Swanson   05/23/2011  Indications: IUP @ 39 weeks, h/o previous C/D, PROM, declines TOLAC   Pre-operative Diagnosis: previous cesarean section/prolong rupture of membranes.   Post-operative Diagnosis: Same   Surgeon: Antionette Char A  Assistants: none  Anesthesia: spinal  Procedure Details:  The patient was seen in the L&D unit. The risks, benefits, complications, treatment options, and expected outcomes were discussed with the patient. The patient concurred with the proposed plan, giving informed consent. The patient was identified as Nancy Swanson and the procedure verified as C-Section Delivery. A Time Out was held and the above information confirmed.  After induction of anesthesia, the patient was draped and prepped in the usual sterile manner. A transverse incision was made and carried down through the subcutaneous tissue to the fascia. The fascial incision was made and extended transversely. The fascia was separated from the underlying rectus tissue superiorly and inferiorly. The peritoneum was identified and entered. The peritoneal incision was extended longitudinally. The utero-vesical peritoneal reflection was incised transversely and the bladder flap was bluntly freed from the lower uterine segment. A low transverse uterine incision was made. Delivered from cephalic presentation was  living newborn female infant with Apgar scores of 9 at one minute and 9 at five minutes. A cord ph was not sent. The umbilical cord was clamped and cut cord. A sample was obtained for evaluation. The placenta was removed Intact and appeared normal.  The uterine incision was closed with running locked sutures of 1-0 Monocryl. A second imbricating layer of the same suture was placed.  Hemostasis was observed. The paracolic gutters were irrigated. The fascia was then reapproximated with running sutures of 1-0Vicryl. The subcuticular closure was performed using 3-0  Monocryl.  Instrument, sponge, and needle counts were correct prior the abdominal closure and were correct at the conclusion of the case.    Findings:   Estimated Blood Loss: * No blood loss amount entered *   Total IV Fluids: 2300 ml   Urine Output: 100CC OF clear urine  Specimens:  Specimens    None       Complications: no complications  Disposition: PACU - hemodynamically stable.  Maternal Condition: stable   Baby condition / location:  nursery-stable    Signed: Surgeon(s): Roseanna Rainbow, MD

## 2011-05-23 NOTE — Progress Notes (Signed)
Nancy Swanson is a 25 y.o. G2P1001 at [redacted]w[redacted]d by LMP admitted for rupture of membranes  Subjective:   Objective: BP 97/43  Pulse 78  Temp(Src) 97.5 F (36.4 C) (Oral)  Resp 16  Ht 5\' 4"  (1.626 m)  Wt 82.101 kg (181 lb)  BMI 31.07 kg/m2      FHT:  FHR: 140 bpm, variability: moderate,  accelerations:  Present,  decelerations:  Absent UC:   irregular, every 8 minutes SVE:   Dilation: 2 Effacement (%): 60 Station: -1 Exam by:: A. Earna Coder, RN  Labs: Lab Results  Component Value Date   WBC 11.4* 05/22/2011   HGB 13.3 05/22/2011   HCT 38.4 05/22/2011   MCV 89.5 05/22/2011   PLT 252 05/22/2011    Assessment / Plan: PROM, h/o a prior C/D, declines TOLAC  Labor: See above Preeclampsia:  N/A Fetal Wellbeing:  Category I Pain Control:  none I/D:  No signs/sxs of overt infection Anticipated MOD:  Repeat C/D  JACKSON-MOORE,Harmoney Sienkiewicz A 05/23/2011, 7:43 AM

## 2011-05-23 NOTE — Plan of Care (Signed)
Problem: Consults Goal: Birthing Suites Patient Information Press F2 to bring up selections list  Outcome: Progressing  Pt 37-[redacted] weeks EGA  Comments:  39.2

## 2011-05-24 LAB — HEMOGLOBIN: Hemoglobin: 11.1 g/dL — ABNORMAL LOW (ref 12.0–15.0)

## 2011-05-24 MED ORDER — KETOROLAC TROMETHAMINE 30 MG/ML IJ SOLN: 30.0000 mg | Freq: Four times a day (QID) | INTRAMUSCULAR | Status: AC | PRN

## 2011-05-24 MED ORDER — NALBUPHINE HCL 10 MG/ML IJ SOLN
5.0000 mg | INTRAMUSCULAR | Status: DC | PRN
  Filled 2011-05-24: qty 1

## 2011-05-24 MED ORDER — NALOXONE HCL 0.4 MG/ML IJ SOLN: 0.4000 mg | INTRAMUSCULAR | Status: DC | PRN

## 2011-05-24 MED ORDER — ONDANSETRON HCL 4 MG/2ML IJ SOLN: 4.0000 mg | Freq: Three times a day (TID) | INTRAMUSCULAR | Status: DC | PRN

## 2011-05-24 MED ORDER — DIPHENHYDRAMINE HCL 50 MG/ML IJ SOLN: 25.0000 mg | INTRAMUSCULAR | Status: DC | PRN

## 2011-05-24 MED ORDER — SODIUM CHLORIDE 0.9 % IV SOLN
1.0000 ug/kg/h | INTRAVENOUS | Status: DC | PRN
  Filled 2011-05-24: qty 2.5

## 2011-05-24 MED ORDER — DIPHENHYDRAMINE HCL 25 MG PO CAPS: 25.0000 mg | ORAL_CAPSULE | ORAL | Status: DC | PRN

## 2011-05-24 MED ORDER — IBUPROFEN 600 MG PO TABS
600.0000 mg | ORAL_TABLET | Freq: Four times a day (QID) | ORAL | Status: DC | PRN
  Filled 2011-05-24 (×4): qty 1

## 2011-05-24 MED ORDER — SODIUM CHLORIDE 0.9 % IJ SOLN: 3.0000 mL | INTRAMUSCULAR | Status: DC | PRN

## 2011-05-24 MED ORDER — DIPHENHYDRAMINE HCL 50 MG/ML IJ SOLN: 12.5000 mg | INTRAMUSCULAR | Status: DC | PRN

## 2011-05-24 MED ORDER — METOCLOPRAMIDE HCL 5 MG/ML IJ SOLN: 10.0000 mg | Freq: Three times a day (TID) | INTRAMUSCULAR | Status: DC | PRN

## 2011-05-24 NOTE — Anesthesia Postprocedure Evaluation (Signed)
  Anesthesia Post-op Note  Patient: Nancy Swanson  Procedure(s) Performed:  CESAREAN SECTION - Repeat cesarean section with delivery of baby boy at 16. Apgars 9/9.  Patient Location: PACU and Mother/Baby  Anesthesia Type: Spinal  Level of Consciousness: awake, alert  and oriented  Airway and Oxygen Therapy: Patient Spontanous Breathing   Post-op Assessment: Patient's Cardiovascular Status Stable and Respiratory Function Stable  Post-op Vital Signs: stable  Complications: No apparent anesthesia complications

## 2011-05-24 NOTE — Progress Notes (Signed)
Encounter addended by: Len Blalock on: 05/24/2011  3:52 PM<BR>     Documentation filed: Notes Section

## 2011-05-24 NOTE — Progress Notes (Signed)
  Subjective: POD# 1 s/p Cesarean Delivery.  Indications: repeat, pt request  RH status/Rubella reviewed. Feeding: breast Patient reports tolerating PO.  Denies HA/SOB/C/P/N/V/dizziness.  Reports flatus or BM. Breast symptoms: None.  She reports vaginal bleeding as normal, without clots.  She is ambulating, urinating without difficulty.     Objective: Vital signs in last 24 hours: BP 102/66  Pulse 71  Temp(Src) 98.1 F (36.7 C) (Oral)  Resp 18  Ht 5\' 4"  (1.626 m)  Wt 82.101 kg (181 lb)  BMI 31.07 kg/m2  SpO2 99%       Physical Exam:  General: alert CV: Regular rate and rhythm Resp: clear Abdomen: soft, nontender, normal bowel sounds Lochia: minimal Uterine Fundus: firm, below umbilicus, nontender Incision: dressing C/D Ext: extremities normal, atraumatic, no cyanosis or edema    Basename 05/24/11 0535 05/22/11 1630  HGB 11.1* 13.3  HCT -- 38.4      Assessment/Plan: 25 y.o.  status post Cesarean section. POD# 1.   Doing well, stable.              Advance diet as tolerated Start po pain meds D/C foley  HLIV  Ambulate IS Routine post-op care  JACKSON-MOORE,Breyonna Nault A 05/24/2011, 10:35 AM

## 2011-05-25 ENCOUNTER — Encounter (HOSPITAL_COMMUNITY): Payer: Self-pay | Admitting: Obstetrics & Gynecology

## 2011-05-25 MED ORDER — CITRIC ACID-SODIUM CITRATE 334-500 MG/5ML PO SOLN: 30.0000 mL | Freq: Once | ORAL | Status: DC

## 2011-05-25 NOTE — Progress Notes (Signed)
  Postoperative day #2 Vital signs normal Fundus firm Lochia moderate Legs negative No complaints doing well

## 2011-05-25 NOTE — Progress Notes (Signed)
UR chart review completed.  

## 2011-05-26 ENCOUNTER — Encounter (HOSPITAL_COMMUNITY): Payer: Self-pay | Admitting: *Deleted

## 2011-05-26 MED ORDER — OXYCODONE-ACETAMINOPHEN 5-325 MG PO TABS: 1.0000 | ORAL_TABLET | ORAL | Status: AC | PRN
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
## 2011-05-26 NOTE — Discharge Summary (Signed)
Obstetric Discharge Summary Reason for Admission: onset of labor Prenatal Procedures: none Intrapartum Procedures: cesarean: low cervical, transverse Postpartum Procedures: none Complications-Operative and Postpartum: none Hemoglobin  Date Value Range Status  05/24/2011 11.1* 12.0-15.0 (g/dL) Final     HCT  Date Value Range Status  05/22/2011 38.4  36.0-46.0 (%) Final    Discharge Diagnoses: Term Pregnancy-delivered  Discharge Information: Date: 05/26/2011 Activity: pelvic rest Diet: routine Medications: Percocet Condition: stable Instructions: refer to practice specific booklet Discharge to: home Follow-up Information    Follow up with Gabby Rackers A, MD. Call in 6 weeks.   Contact information:   [address] [place] [address] [phone]          Newborn Data: Live born female  Birth Weight: 6 lb 15.5 oz (3160 g) APGAR: 9, 9  Home with mother.  Nancy Swanson A 05/26/2011, 7:31 AM

## 2011-08-09 ENCOUNTER — Other Ambulatory Visit: Payer: Self-pay | Admitting: Obstetrics

## 2012-07-04 ENCOUNTER — Emergency Department (HOSPITAL_COMMUNITY)
Admission: EM | Admit: 2012-07-04 | Discharge: 2012-07-05 | Disposition: A | Discharge status: Home / Self Care | Payer: Medicaid Other | Attending: Emergency Medicine | Admitting: Emergency Medicine

## 2012-07-04 ENCOUNTER — Encounter (HOSPITAL_COMMUNITY): Payer: Self-pay | Admitting: *Deleted

## 2012-07-04 DIAGNOSIS — E876 Hypokalemia: Secondary | ICD-10-CM | POA: Insufficient documentation

## 2012-07-04 DIAGNOSIS — R599 Enlarged lymph nodes, unspecified: Secondary | ICD-10-CM | POA: Insufficient documentation

## 2012-07-04 DIAGNOSIS — R509 Fever, unspecified: Secondary | ICD-10-CM | POA: Insufficient documentation

## 2012-07-04 DIAGNOSIS — IMO0001 Reserved for inherently not codable concepts without codable children: Secondary | ICD-10-CM | POA: Insufficient documentation

## 2012-07-04 DIAGNOSIS — Z79899 Other long term (current) drug therapy: Secondary | ICD-10-CM | POA: Insufficient documentation

## 2012-07-04 DIAGNOSIS — Z8742 Personal history of other diseases of the female genital tract: Secondary | ICD-10-CM | POA: Insufficient documentation

## 2012-07-04 DIAGNOSIS — J069 Acute upper respiratory infection, unspecified: Secondary | ICD-10-CM | POA: Insufficient documentation

## 2012-07-04 DIAGNOSIS — R000 Tachycardia, unspecified: Secondary | ICD-10-CM | POA: Insufficient documentation

## 2012-07-04 DIAGNOSIS — L03319 Cellulitis of trunk, unspecified: Secondary | ICD-10-CM | POA: Insufficient documentation

## 2012-07-04 DIAGNOSIS — J3489 Other specified disorders of nose and nasal sinuses: Secondary | ICD-10-CM | POA: Insufficient documentation

## 2012-07-04 DIAGNOSIS — F17200 Nicotine dependence, unspecified, uncomplicated: Secondary | ICD-10-CM | POA: Insufficient documentation

## 2012-07-04 DIAGNOSIS — L02219 Cutaneous abscess of trunk, unspecified: Secondary | ICD-10-CM | POA: Insufficient documentation

## 2012-07-04 DIAGNOSIS — L0291 Cutaneous abscess, unspecified: Secondary | ICD-10-CM

## 2012-07-04 DIAGNOSIS — K219 Gastro-esophageal reflux disease without esophagitis: Secondary | ICD-10-CM | POA: Insufficient documentation

## 2012-07-04 LAB — CBC
MCH: 30.3 pg (ref 26.0–34.0)
MCHC: 35.7 g/dL (ref 30.0–36.0)
Platelets: 330 10*3/uL (ref 150–400)
RDW: 13.8 % (ref 11.5–15.5)

## 2012-07-04 LAB — POCT I-STAT, CHEM 8
Creatinine, Ser: 1.1 mg/dL (ref 0.50–1.10)
HCT: 41 % (ref 36.0–46.0)
Hemoglobin: 13.9 g/dL (ref 12.0–15.0)
Potassium: 3.2 mEq/L — ABNORMAL LOW (ref 3.5–5.1)
Sodium: 137 mEq/L (ref 135–145)
TCO2: 20 mmol/L (ref 0–100)

## 2012-07-04 MED ORDER — ACETAMINOPHEN 325 MG PO TABS
650.0000 mg | ORAL_TABLET | Freq: Once | ORAL | Status: AC
  Administered 2012-07-04: 650 mg via ORAL
  Filled 2012-07-04: qty 2

## 2012-07-04 MED ORDER — CEPHALEXIN 250 MG PO CAPS
250.0000 mg | ORAL_CAPSULE | Freq: Once | ORAL | Status: AC
  Administered 2012-07-05: 250 mg via ORAL
  Filled 2012-07-04: qty 1

## 2012-07-04 MED ORDER — SODIUM CHLORIDE 0.9 % IV SOLN
Freq: Once | INTRAVENOUS | Status: AC
  Administered 2012-07-04: 150 mL/h via INTRAVENOUS

## 2012-07-04 MED ORDER — POTASSIUM CHLORIDE CRYS ER 20 MEQ PO TBCR
20.0000 meq | EXTENDED_RELEASE_TABLET | Freq: Two times a day (BID) | ORAL | Status: DC
  Administered 2012-07-04: 20 meq via ORAL
  Filled 2012-07-04: qty 1

## 2012-07-04 MED ORDER — ONDANSETRON HCL 4 MG/2ML IJ SOLN
4.0000 mg | Freq: Once | INTRAMUSCULAR | Status: AC
  Administered 2012-07-04: 4 mg via INTRAVENOUS
  Filled 2012-07-04: qty 2

## 2012-07-04 MED ORDER — MORPHINE SULFATE 4 MG/ML IJ SOLN
4.0000 mg | Freq: Once | INTRAMUSCULAR | Status: AC
  Administered 2012-07-04: 4 mg via INTRAVENOUS
  Filled 2012-07-04: qty 1

## 2012-07-04 MED ORDER — HYDROCODONE-ACETAMINOPHEN 5-325 MG PO TABS: 2.0000 | ORAL_TABLET | ORAL | Status: DC | PRN

## 2012-07-04 MED ORDER — OXYCODONE-ACETAMINOPHEN 5-325 MG PO TABS: 2.0000 | ORAL_TABLET | Freq: Once | ORAL | Status: DC

## 2012-07-04 MED ORDER — POTASSIUM CHLORIDE 10 MEQ/100ML IV SOLN
10.0000 meq | INTRAVENOUS | Status: AC
  Administered 2012-07-04 (×2): 10 meq via INTRAVENOUS
  Filled 2012-07-04 (×2): qty 100

## 2012-07-04 MED ORDER — CEPHALEXIN 500 MG PO CAPS: 500.0000 mg | ORAL_CAPSULE | Freq: Four times a day (QID) | ORAL | Status: DC

## 2012-07-04 NOTE — ED Notes (Signed)
Pt in c/o cough and congestion, body aches and fever since Thursday, also abscess to groin area over last two days.

## 2012-07-04 NOTE — ED Notes (Signed)
Patient is alert and oriented x3.  She is complaining of generalized pain and additional pain In her left groin area from a boil x3 days.  She currently rates her pain at a 8 of 10.   Her body aches started on thursday

## 2012-07-04 NOTE — ED Provider Notes (Signed)
Medical screening examination/treatment/procedure(s) were performed by non-physician practitioner and as supervising physician I was immediately available for consultation/collaboration.   Richardean Canal, MD 07/04/12 [phone]

## 2012-07-04 NOTE — ED Provider Notes (Signed)
History     CSN: [id number]  Arrival date & time 07/04/12  4098   First MD Initiated Contact with Patient 07/04/12 2041      Chief Complaint  Patient presents with  . Fever  . Abscess  . URI    (Consider location/radiation/quality/duration/timing/severity/associated sxs/prior treatment) HPI Comments: Patient states, that for the last 3, days.  She's had URI symptoms, for, which she's been taking Mucinex with little relief.  She also has an abscess in her left groin.  For the past 3, days, as well.  She has a history of abscesses, mostly in her axilla area.  She has run a fever up to 104, for, which she's been taking over-the-counter Motrin, but has taken none today, as she "ran, out.".  She, states she is generalized myalgias, denies cough, or sore throat, nausea, vomiting, diarrhea  The history is provided by the patient.    Past Medical History  Diagnosis Date  . Acid reflux   . Bartholin cyst     Past Surgical History  Procedure Date  . Cesarean section   . Cystectomy      glands removed from bil axilla due to cyst formation  . Sweat glands     removed from under arms  . Cesarean section 05/23/2011    Procedure: CESAREAN SECTION;  Surgeon: Roseanna Rainbow, MD;  Location: [facility];  Service: Gynecology;  Laterality: N/A;  Repeat cesarean section with delivery of baby boy at 75. Apgars 9/9.    History reviewed. No pertinent family history.  History  Substance Use Topics  . Smoking status: Current Every Day Smoker -- 0.2 packs/day  . Smokeless tobacco: Never Used  . Alcohol Use: No    OB History    Grav Para Term Preterm Abortions TAB SAB Ect Mult Living   2 2 2  0 0 0 0 0 0 2      Review of Systems  Constitutional: Positive for fever and chills.  HENT: Positive for rhinorrhea. Negative for ear pain, sore throat, neck pain, neck stiffness and postnasal drip.   Respiratory: Negative for cough and shortness of breath.   Gastrointestinal: Negative for  nausea and abdominal pain.  Genitourinary: Negative for dysuria.  Skin: Positive for wound.  Neurological: Negative for weakness and headaches.  Hematological: Positive for adenopathy.    Allergies  Review of patient's allergies indicates no active allergies.  Home Medications   Current Outpatient Rx  Name  Route  Sig  Dispense  Refill  . IBUPROFEN 200 MG PO TABS   Oral   Take 400 mg by mouth every 6 (six) hours as needed. Pain         . CEPHALEXIN 500 MG PO CAPS   Oral   Take 1 capsule (500 mg total) by mouth 4 (four) times daily.   28 capsule   0   . HYDROCODONE-ACETAMINOPHEN 5-325 MG PO TABS   Oral   Take 2 tablets by mouth every 4 (four) hours as needed for pain.   10 tablet   0     BP 101/52  Pulse 97  Temp 99.2 F (37.3 C) (Oral)  Resp 20  SpO2 98%  Breastfeeding? Unknown  Physical Exam  Constitutional: She appears well-developed and well-nourished.  HENT:  Head: Normocephalic.  Eyes: Pupils are equal, round, and reactive to light.  Neck: Normal range of motion.  Cardiovascular: Tachycardia present.   Pulmonary/Chest: Effort normal. She exhibits no tenderness.  Abdominal: Soft. She exhibits no distension.  There is no tenderness.  Musculoskeletal: Normal range of motion. She exhibits tenderness. She exhibits no edema.       Legs: Neurological: She is alert.  Skin: Skin is warm.    ED Course  INCISION AND DRAINAGE Date/Time: 07/04/2012 11:17 PM Performed by: Arman Filter Authorized by: Arman Filter Consent: Verbal consent obtained. Risks and benefits: risks, benefits and alternatives were discussed Consent given by: patient Patient understanding: patient states understanding of the procedure being performed Patient identity confirmed: verbally with patient Time out: Immediately prior to procedure a "time out" was called to verify the correct patient, procedure, equipment, support staff and site/side marked as required. Type: abscess Body  area: lower extremity Location details: left leg Anesthesia: local infiltration Local anesthetic: lidocaine 1% with epinephrine Anesthetic total: 3 ml Patient sedated: no Scalpel size: 11 Needle gauge: 22 Incision type: single straight Complexity: simple Drainage: purulent Drainage amount: copious Wound treatment: wound left open Patient tolerance: Patient tolerated the procedure well with no immediate complications.   (including critical care time)  Labs Reviewed  POCT I-STAT, CHEM 8 - Abnormal; Notable for the following:    Potassium 3.2 (*)     BUN 4 (*)     All other components within normal limits  CBC   No results found.   1. Abscess   2. Hypokalemia       MDM   Due to fever will obtain labs, will I&D abscess  Copious, purulent discharge on incision and drainage.  We'll treat with antibiotic, pain control.  Follow up with primary care physician       Arman Filter, NP 07/04/12 2320

## 2013-11-15 ENCOUNTER — Emergency Department (HOSPITAL_COMMUNITY)
Admission: EM | Admit: 2013-11-15 | Discharge: 2013-11-15 | Disposition: A | Discharge status: Home / Self Care | Payer: Medicaid Other | Attending: Emergency Medicine | Admitting: Emergency Medicine

## 2013-11-15 ENCOUNTER — Emergency Department (HOSPITAL_COMMUNITY): Payer: Medicaid Other

## 2013-11-15 ENCOUNTER — Encounter (HOSPITAL_COMMUNITY): Payer: Self-pay | Admitting: Emergency Medicine

## 2013-11-15 DIAGNOSIS — K439 Ventral hernia without obstruction or gangrene: Secondary | ICD-10-CM

## 2013-11-15 DIAGNOSIS — F17200 Nicotine dependence, unspecified, uncomplicated: Secondary | ICD-10-CM | POA: Insufficient documentation

## 2013-11-15 DIAGNOSIS — Z792 Long term (current) use of antibiotics: Secondary | ICD-10-CM | POA: Insufficient documentation

## 2013-11-15 DIAGNOSIS — Z8742 Personal history of other diseases of the female genital tract: Secondary | ICD-10-CM | POA: Insufficient documentation

## 2013-11-15 DIAGNOSIS — Z3202 Encounter for pregnancy test, result negative: Secondary | ICD-10-CM | POA: Insufficient documentation

## 2013-11-15 LAB — POC URINE PREG, ED: Preg Test, Ur: NEGATIVE

## 2013-11-15 LAB — I-STAT CREATININE, ED: Creatinine, Ser: 1 mg/dL (ref 0.50–1.10)

## 2013-11-15 MED ORDER — IOHEXOL 300 MG/ML  SOLN
50.0000 mL | Freq: Once | INTRAMUSCULAR | Status: AC | PRN
  Administered 2013-11-15: 50 mL via ORAL

## 2013-11-15 MED ORDER — IOHEXOL 300 MG/ML  SOLN
100.0000 mL | Freq: Once | INTRAMUSCULAR | Status: AC | PRN
  Administered 2013-11-15: 100 mL via INTRAVENOUS

## 2013-11-15 NOTE — Discharge Instructions (Signed)
You have a small ventral hernia containing only fatty tissue.  You may follow up with surgery for further care if it becoming a problem.  Use resources below to find a primary care provider.   Hernia A hernia occurs when an internal organ pushes out through a weak spot in the abdominal wall. Hernias most commonly occur in the groin and around the navel. Hernias often can be pushed back into place (reduced). Most hernias tend to get worse over time. Some abdominal hernias can get stuck in the opening (irreducible or incarcerated hernia) and cannot be reduced. An irreducible abdominal hernia which is tightly squeezed into the opening is at risk for impaired blood supply (strangulated hernia). A strangulated hernia is a medical emergency. Because of the risk for an irreducible or strangulated hernia, surgery may be recommended to repair a hernia. CAUSES   Heavy lifting.  Prolonged coughing.  Straining to have a bowel movement.  A cut (incision) made during an abdominal surgery. HOME CARE INSTRUCTIONS   Bed rest is not required. You may continue your normal activities.  Avoid lifting more than 10 pounds (4.5 kg) or straining.  Cough gently. If you are a smoker it is best to stop. Even the best hernia repair can break down with the continual strain of coughing. Even if you do not have your hernia repaired, a cough will continue to aggravate the problem.  Do not wear anything tight over your hernia. Do not try to keep it in with an outside bandage or truss. These can damage abdominal contents if they are trapped within the hernia sac.  Eat a normal diet.  Avoid constipation. Straining over long periods of time will increase hernia size and encourage breakdown of repairs. If you cannot do this with diet alone, stool softeners may be used. SEEK IMMEDIATE MEDICAL CARE IF:   You have a fever.  You develop increasing abdominal pain.  You feel nauseous or vomit.  Your hernia is stuck outside  the abdomen, looks discolored, feels hard, or is tender.  You have any changes in your bowel habits or in the hernia that are unusual for you.  You have increased pain or swelling around the hernia.  You cannot push the hernia back in place by applying gentle pressure while lying down. MAKE SURE YOU:   Understand these instructions.  Will watch your condition.  Will get help right away if you are not doing well or get worse. Document Released: 07/27/2005 Document Revised: 10/19/2011 Document Reviewed: 03/15/2008 [facility] Patient Information 2014 [place], [place].   Emergency Department Resource Guide 1) Find a Doctor and Pay Out of Pocket Although you won't have to find out who is covered by your insurance plan, it is a good idea to ask around and get recommendations. You will then need to call the office and see if the doctor you have chosen will accept you as a new patient and what types of options they offer for patients who are self-pay. Some doctors offer discounts or will set up payment plans for their patients who do not have insurance, but you will need to ask so you aren't surprised when you get to your appointment.  2) Contact Your Local Health Department Not all health departments have doctors that can see patients for sick visits, but many do, so it is worth a call to see if yours does. If you don't know where your local health department is, you can check in your phone book. The CDC also  has a tool to help you locate your state's health department, and many state websites also have listings of all of their local health departments.  3) Find a Walk-in Clinic If your illness is not likely to be very severe or complicated, you may want to try a walk in clinic. These are popping up all over the country in pharmacies, drugstores, and shopping centers. They're usually staffed by nurse practitioners or physician assistants that have been trained to treat common illnesses and  complaints. They're usually fairly quick and inexpensive. However, if you have serious medical issues or chronic medical problems, these are probably not your best option.  No Primary Care Doctor: - Call Health Connect at  [phone] - they can help you locate a primary care doctor that  accepts your insurance, provides certain services, etc. - Physician Referral Service- [phone]  Chronic Pain Problems: Organization         Address  Phone   Notes  [facility]  [phone] Patients need to be referred by their primary care doctor.   Medication Assistance: Organization         Address  Phone   Notes  [facility] [address] [place], [place] [address] [phone] --Must be a resident of [facility] -- Must have NO insurance coverage whatsoever (no Medicaid/ Medicare, etc.) -- The pt. MUST have a primary care doctor that directs their care regularly and follows them in the community   [facility]  [phone]   [employer]  [phone]    Agencies that provide inexpensive medical care: Organization         Address  Phone   Notes  [facility]  [phone]   [facility]    [phone]   [facility] [address] [place], [place] [address] [phone]   [facility] [address] [place] [place] [phone]   Planned Parenthood    [phone]   [facility]    [phone]   [facility]  [address], [place] Phone:  [phone], Fax:  [phone] Hours of Operation:  9 am - 6 pm, M-F.  Also accepts Medicaid/Medicare and self-pay.  [facility]  [address], [place] Phone: [phone], Fax: [phone]. Hours of Operation:  8:30 am - 5:30 pm, M-F.  Also accepts Medicaid and self-pay.  [facility] [place] [address] [place] Phone: [phone]   [facility] [address] Natasha Bence [place], [place] [phone], Ext. [phone] Mondays & Thursdays: 7-9 AM.  First 15 patients are seen on a first come, first serve basis.    Medicaid-accepting [facility] Providers:  Organization         Address  Phone   Notes  [facility] [address], [place] [phone] Also accepts self-pay patients.  [facility] [address] Laurell Josephs [place], [place]  [phone]   [facility] [address], [place] [phone]   [facility] [address], [place] [phone]   Renaye Rakers [address], [place]   [phone] Only accepts [place] Access [place] patients after they have their name applied to their card.   Self-Pay (no insurance) in  [facility]:  Organization         Address  Phone   Notes  Sickle Cell Patients, [facility] [address] [place], [place] [phone]   [facility] [address] [place], [place] [phone]   [facility]  [address], [place] [phone]   [facility]/Dr. Osei-Bonsu  [address] [place] or [address], [place] [phone] Phone number for both [place] and [place] locations is the same.  [facility] [address] [place] [phone]   [facility] [address] [place] or [address] [phone]   [facility] [address], [place] [phone], phone; [phone], fax Sees patients 1st and 3rd Saturday of every month.  Must not qualify for public or private insurance (i.e. Medicaid, Medicare, [place] Health Choice, Veterans' Benefits)  Household income should be no more than 200% of the poverty level  The clinic cannot treat you if you are pregnant or think you are pregnant  Sexually transmitted diseases are not treated at the clinic.    Dental Care: Organization         Address  Phone  Notes  [facility] Department of [facility] [address] [place], [place] [phone] Accepts children up to age 15 who are enrolled in [place] or [place] Health Choice; pregnant women with a Medicaid card; and children who have applied for Medicaid or [place] Health Choice, but were declined, whose parents can pay a reduced fee at time of service.  [facility] Department of [facility]  [address], [place] [phone] Accepts children up to age 44 who are enrolled in [place] or [place] Health Choice; pregnant women with a Medicaid card; and children who have applied for Medicaid or [place] Health Choice, but were declined, whose parents can pay a reduced fee at time of service.  [facility]  [address] [place], [place] [phone] Patients are seen by appointment only. Walk-ins are not accepted. [facility] will see patients 50 years of age and older. Monday - Tuesday (8am-5pm) Most Wednesdays (8:30-5pm) $30 per visit, cash only  [facility]  [address], [facility] [phone] Patients are seen by appointment only. Walk-ins are not accepted. [facility] will see patients 49 years of age and older. One Wednesday Evening (Monthly: Volunteer Based).  $30 per visit, cash only  [employer]  [phone] for adults; Children under age 26, call Graduate Pediatric Dentistry at [phone]. Children aged 51-14, please call [phone] to request a pediatric application.  Dental services are provided in all areas of dental care including fillings, crowns and bridges, complete and partial dentures, implants, gum treatment, root canals, and extractions. Preventive care is  also provided. Treatment is provided to both adults and children. Patients are selected via a lottery and there is often a waiting list.   [facility] [address] [place]  [phone] [email or web address]   [facility] [address] [place], [place] [phone], Ext. [phone] Second and Fourth Thursday of each month, opens at 6:30 AM; Clinic ends at 9 AM.  Patients are seen on a first-come first-served basis, and a limited number are seen during each  clinic.   [facility]  [address], [place], [place] [phone]   Eligibility Requirements You must have lived in [place], [place], or [place] counties for at least the last three months.   You cannot be eligible for state or federal sponsored [place] Cityhealthcare insurance, including CIG[employer]eterans Administration, [place]icaid, or [employer]icare.   You generally cannot be eligible for healthcare insurance through your employer.    How to apply: Eligibility screenings are held every Tuesday and Wednesday afternoon from 1:00 pm until 4:00 pm. You do not need an appointment for the interview!  [facility] [address], [place], [place] [phone]   [facility]  [phone]   [facility]  [phone]   [facility]  [phone]    Behavioral Health Resources in the Community: Intensive Outpatient Programs Organization         Address  Phone  Notes  [facility] [address], [place], [place] [phone]   [facility] [address], [place], [place] [phone]   [facility] [address], [place], [place]  [phone]   [facility] [address],  [place], [place] [phone] or [phone]   Substance Abuse Resources Organization         Address  Phone  Notes  [facility]  [phone]   [facility]  [phone]   [facility]  [phone]   [facility]  [phone]   Residential & Outpatient Substance Abuse Program  [phone]   Psychological Services Organization         Address  Phone  Notes  [facility]  [phone]- [phone]   [facility]  [phone]- [phone]   [facility] [address], [place] [phone] or [phone]    Mobile Crisis Teams Organization         Address  Phone  Notes  [facility], Mobile Crisis Care Unit  [phone]   [facility]  [address] [place], [place] [phone]   Doristine LocksSharon DeEsch [address] [place] [phone]    Self-Help/Support Groups Organization         Address  Phone             Notes  [facility] [place] - variety of support groups  [phone] Call for more information  Narcotics Anonymous (NA), [facility] [address], [place]  2 meetings at this location   Statisticianesidential Treatment Programs Organization         Address  Phone  Notes  [facility] [address],    [place]  [phone]   [facility]  [address], [place] [phone], [place], [place] [phone]   [facility] [address], [place] [phone] Admissions: 8am-3pm M-F  [facility] [address]    [place], [place] [phone]   [facility] [address], [place], [place] [phone]   [facility] [address]  [place], [place] [phone]   [facility] - Intensive Outpatient [address], [place], [place] [phone]   [facility] ([facility] [address] [place] [address]  [place], [place] [phone] or [phone]   [facility] ([facility]) [address] [place], [place] [phone] Accepts Medicaid  [facility] [address]  [place] [phone]  Substance Abuse/Addiction Treatment   [facility] Organization         Address  Phone  Notes  [facility]  [phone]   Angie Fava, PhD [address] Ervin Knack [place], [place]   [phone] or [phone]   [facility]   [address] [place], [place] [phone]   [facility] [address] [place], [place] [phone] Insurance/Medicaid/sponsorship through [facility] Families [address]                                    [place], [place] [phone] Therapy/tele-psych/case  [facility] [address], [place] [phone]    Dr. Lolly Mustache  [phone]   [facility]  [employer] [facility] Dept. 1) [address] [place] 2) [address], [place] 3)  [address], [place] [phone]  [phone]   [facility] Child Abuse Hotline [phone] or [phone] (After Hours)

## 2013-11-15 NOTE — ED Provider Notes (Signed)
CSN: [id number]     Arrival date & time 11/15/13  1004 History   First MD Initiated Contact with Patient 11/15/13 1025     Chief Complaint  Patient presents with  . Knot on Upper Abd      (Consider location/radiation/quality/duration/timing/severity/associated sxs/prior Treatment) HPI  28 year old female with history of GERD who presents complaining a knot on upper abdomen.  Pt sts she noticed a knot to her upper abdomen for the past 1 week. She first noticed it when she was changing position felt something. She palpated her abdomen and noticed a knot. Knot is nontender. Has not increased in size. There is no associated symptoms including no fever, chills, nausea vomiting diarrhea, chest pain, shortness of breath, abdominal pain, back pain, lightheadedness, easiness, numbness, weakness. She denies any recent trauma. She denies history of cancer. She denies abnormal weight changes, night sweats. She does not have a primary care Dr. No specific treatment tried.  Past Medical History  Diagnosis Date  . Acid reflux   . Bartholin cyst    Past Surgical History  Procedure Laterality Date  . Cesarean section    . Cystectomy       glands removed from bil axilla due to cyst formation  . Sweat glands      removed from under arms  . Cesarean section  05/23/2011    Procedure: CESAREAN SECTION;  Surgeon: Roseanna RainbowLisa A Jackson-Moore, MD;  Location: [facility];  Service: Gynecology;  Laterality: N/A;  Repeat cesarean section with delivery of baby boy at [id number]. Apgars 9/9.   No family history on file. History  Substance Use Topics  . Smoking status: Current Every Day Smoker -- 0.25 packs/day  . Smokeless tobacco: Never Used  . Alcohol Use: No   OB History   Grav Para Term Preterm Abortions TAB SAB Ect Mult Living   2 2 2  0 0 0 0 0 0 2     Review of Systems  All other systems reviewed and are negative.     Allergies  Review of patient's allergies indicates no active allergies.  Home Medications    Current Outpatient Rx  Name  Route  Sig  Dispense  Refill  . cephALEXin (KEFLEX) 500 MG capsule   Oral   Take 1 capsule (500 mg total) by mouth 4 (four) times daily.   28 capsule   0   . HYDROcodone-acetaminophen (NORCO/VICODIN) 5-325 MG per tablet   Oral   Take 2 tablets by mouth every 4 (four) hours as needed for pain.   10 tablet   0   . ibuprofen (ADVIL,MOTRIN) 200 MG tablet   Oral   Take 400 mg by mouth every 6 (six) hours as needed. Pain          BP 114/85  Pulse 85  Temp(Src) 98.5 F (36.9 C) (Oral)  Resp 16  SpO2 100%  LMP 11/08/2013 Physical Exam  Nursing note and vitals reviewed. Constitutional: She appears well-developed and well-nourished. No distress.  HENT:  Head: Atraumatic.  Eyes: Conjunctivae are normal.  Neck: Neck supple.  Cardiovascular: Normal rate and regular rhythm.   Pulmonary/Chest: Effort normal and breath sounds normal.  Abdominal: Soft. Bowel sounds are normal. She exhibits no distension. There is no tenderness.  Epigastric region: a hard knot can be felt on deep palpation measuring 2x3cm, nontender, mobile and irreducible.   Neurological: She is alert.  Skin: No rash noted.  Psychiatric: She has a normal mood and affect.    ED Course  Procedures (including critical care time)  10:41 AM Pt notice a knot to her epigastric region.  This can be felt with deep palpation.  DDx: lipoma, hernia, malignancy.  No other sxs.  Pt does not have PCP and wants further evaluation.  I discussed this with Dr. Criss Alvine. Plan to obtain abd/pelvis CT for further evaluation.    12:53 PM CT scan demonstrated a small ventral hernia containing fatty tissue. Result was discussed with patient, [place] surgery offer as needed. Resource given. Return precautions discussed.  Labs Review Labs Reviewed  POC URINE PREG, ED  I-STAT CREATININE, ED   Imaging Review Ct Abdomen Pelvis W Contrast  11/15/2013   CLINICAL DATA:  Abdominal pain  EXAM: CT  ABDOMEN AND PELVIS WITH CONTRAST  TECHNIQUE: Multidetector CT imaging of the abdomen and pelvis was performed using the standard protocol following bolus administration of intravenous contrast. Oral contrast was also administered.  CONTRAST:  OMNIPAQUE IOHEXOL 300 MG/ML  SOLN  COMPARISON:  None.  FINDINGS: Lung bases are clear.  The liver is prominent, measuring approximately 18 cm in length. There is a 5 mm cyst in the medial aspect of the posterior segment right lobe of the liver near Morison's pouch. No other focal liver lesions are identified. There is no biliary duct dilatation. Gallbladder wall does not appear appreciably thickened.  Spleen, pancreas, and adrenals appear normal. Kidneys bilaterally show no mass or hydronephrosis on either side. There is no renal or ureteral calculus on either side.  There is a small ventral hernia which contains only fat.  In the pelvis, the urinary bladder is midline with normal wall thickness. There is no pelvic mass or fluid collection. The appendix appears normal.  There is no bowel obstruction. There is no free air or portal venous air.  There is no ascites, adenopathy, or abscess in the abdomen or pelvis. There is no evidence of abdominal aortic aneurysm. There are no blastic or lytic bone lesions.  IMPRESSION: Small ventral hernia containing only fat. There is no appreciable abdominal wall mass or inflammatory focus.  Liver is prominent with with a small cyst in the right lobe. Liver otherwise appears unremarkable.  There is no bowel obstruction or abscess. Appendix appears normal. No renal or ureteral calculus. No hydronephrosis.   Electronically Signed   By: Bretta Bang M.D.   On: 11/15/2013 12:45     EKG Interpretation None      MDM   Final diagnoses:  Ventral hernia    BP 114/85  Pulse 85  Temp(Src) 98.5 F (36.9 C) (Oral)  Resp 16  SpO2 100%  LMP 11/08/2013  I have reviewed nursing notes and vital signs. I personally reviewed the  imaging tests through PACS system  I reviewed available ER/hospitalization records thought the EMR     Fayrene Helper, [place] 11/15/13 1253

## 2013-11-15 NOTE — ED Notes (Signed)
Pt c/o knot to upper abd x 1 wk.  States it hurts when she moves a certain way. Denies NVD.

## 2013-11-16 NOTE — ED Provider Notes (Signed)
Medical screening examination/treatment/procedure(s) were conducted as a shared visit with non-physician practitioner(s) and myself.  I personally evaluated the patient during the encounter.   EKG Interpretation None      patient with firm mass in epigastrium. No significant tenderness. CT shows ventral hernia w/o incarceration of bowel. Will refer as an outpatient.  Audree CamelScott T Ilia Engelbert, MD 11/16/13 [phone]

## 2014-06-04 LAB — PROCEDURE REPORT - SCANNED: Pap: NEGATIVE

## 2014-06-11 ENCOUNTER — Encounter (HOSPITAL_COMMUNITY): Payer: Self-pay | Admitting: Emergency Medicine

## 2015-09-11 IMAGING — CT CT ABD-PELV W/ CM
1 of 2 series · 15 of 32 positions shown, 19 images · IV contrast (OMNIPAQUE 300)
Comparison: None.

CLINICAL DATA: Abdominal pain

EXAM:
CT ABDOMEN AND PELVIS WITH CONTRAST
TECHNIQUE: Multidetector CT imaging of the abdomen and pelvis was performed
using the standard protocol following bolus administration of
intravenous contrast. Oral contrast was also administered.
CONTRAST:  100mL OMNIPAQUE IOHEXOL 300 MG/ML  SOLN

[Series 2: abd/pel with · axial · 0.74mm/px · z∈[+1327,+1742]mm · 15 of 91 slices shown, 19 images]
[im 4/91  soft-tissue]
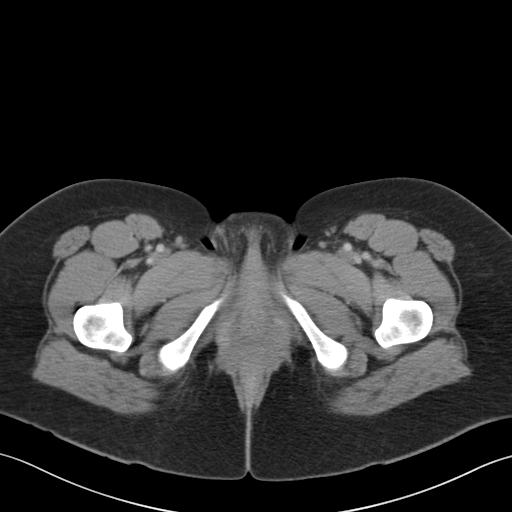
[im 4/91  bone]
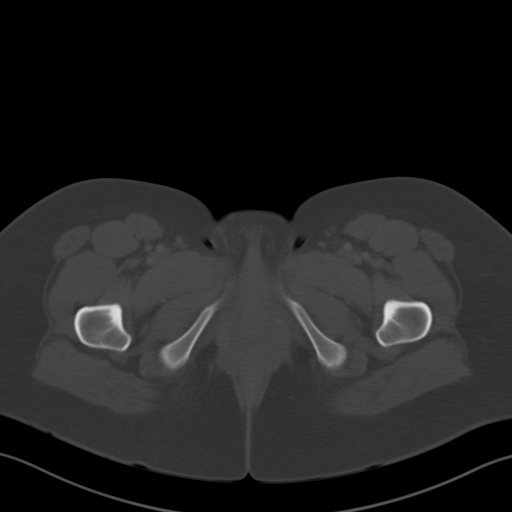
[im 11/91  soft-tissue]
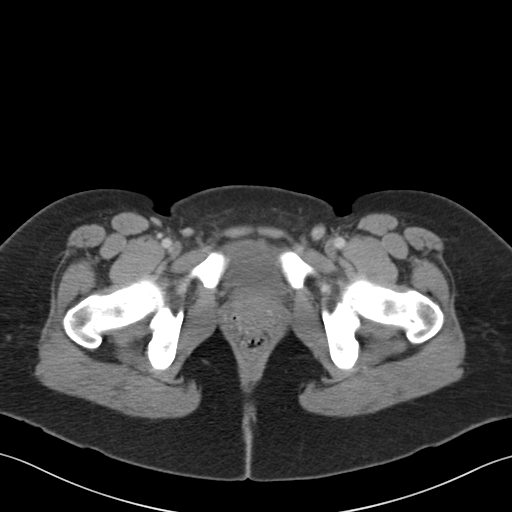
[im 19/91  soft-tissue]
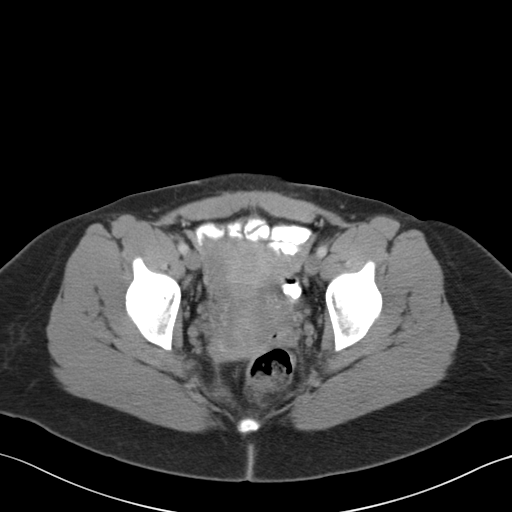
[im 26/91  soft-tissue]
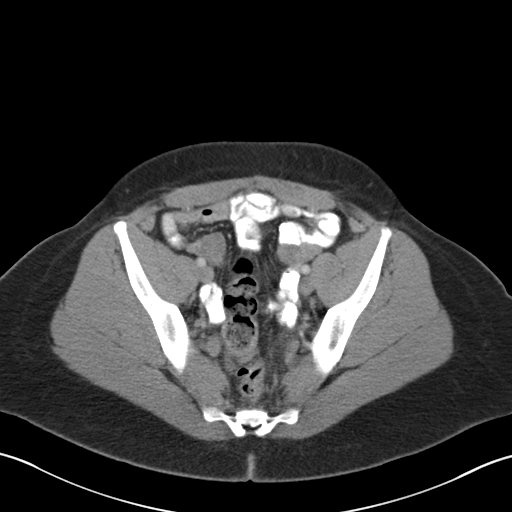
[im 33/91  soft-tissue]
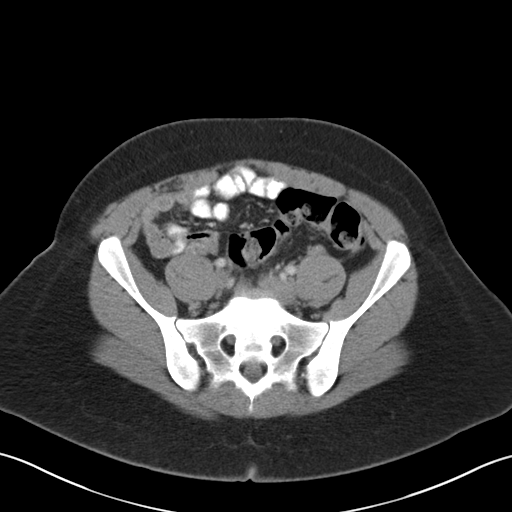
[im 40/91  soft-tissue]
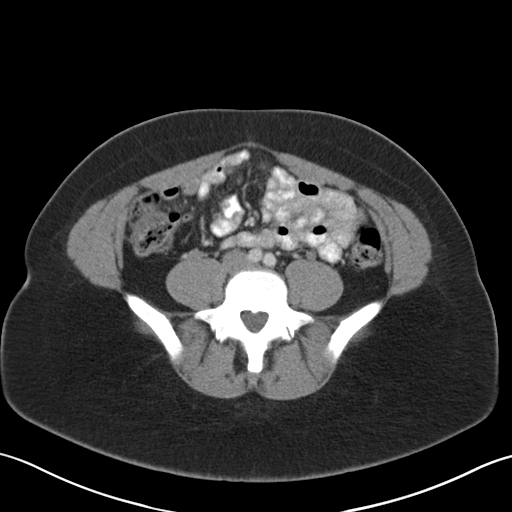
[im 47/91  soft-tissue]
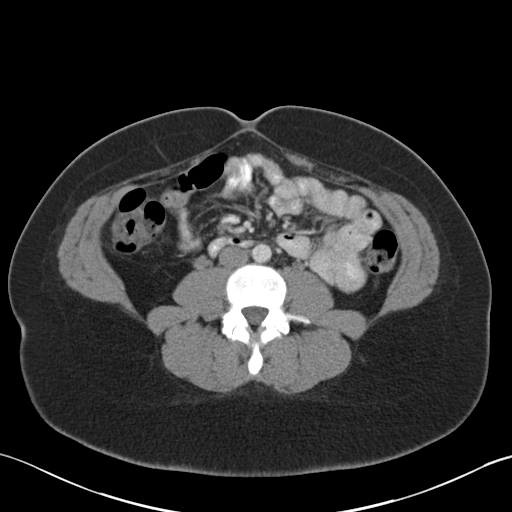
[im 51/91  soft-tissue]
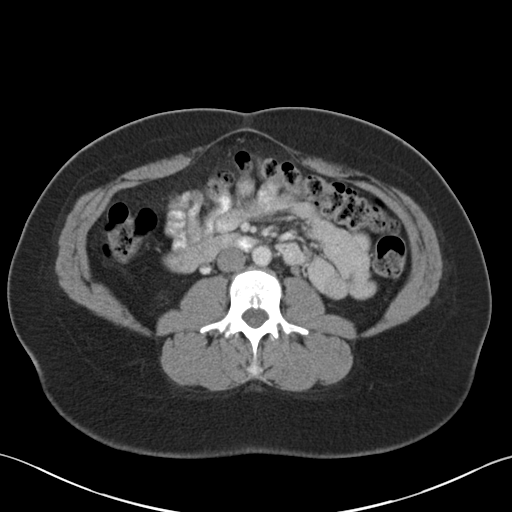
[im 58/91  soft-tissue]
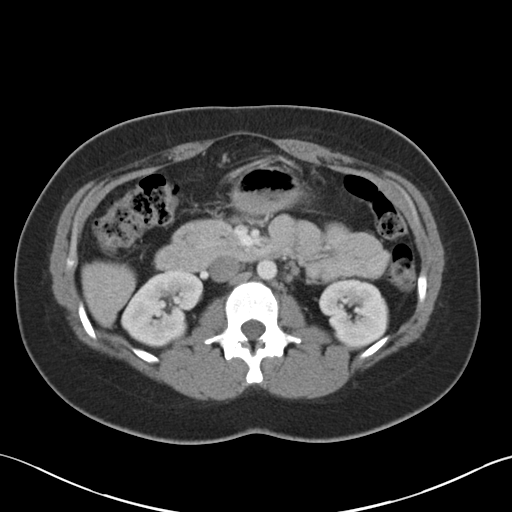
[im 58/91  bone]
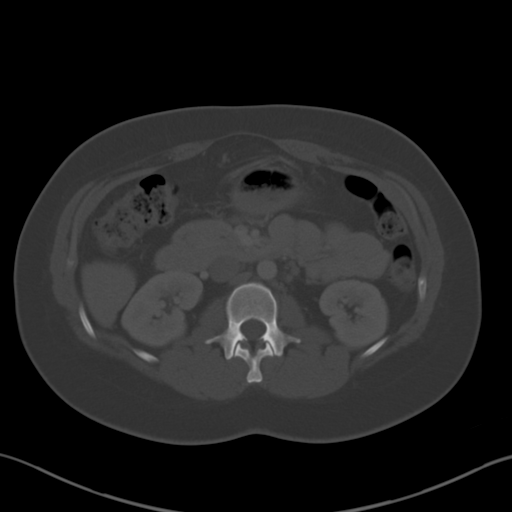
[im 65/91  soft-tissue]
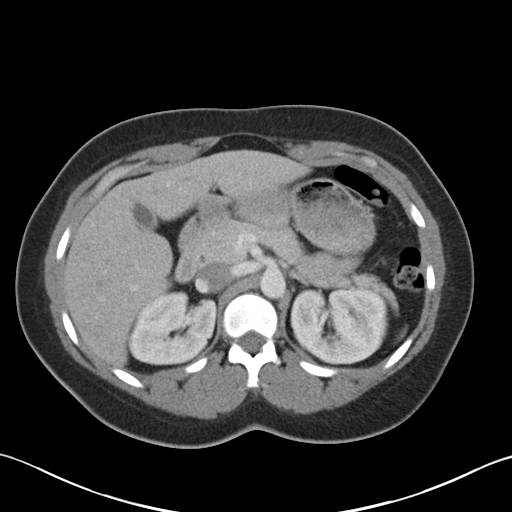
[im 73/91  soft-tissue]
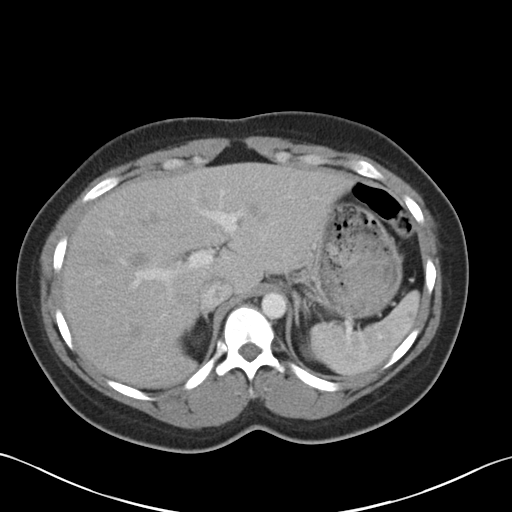
[im 76/91  lung]
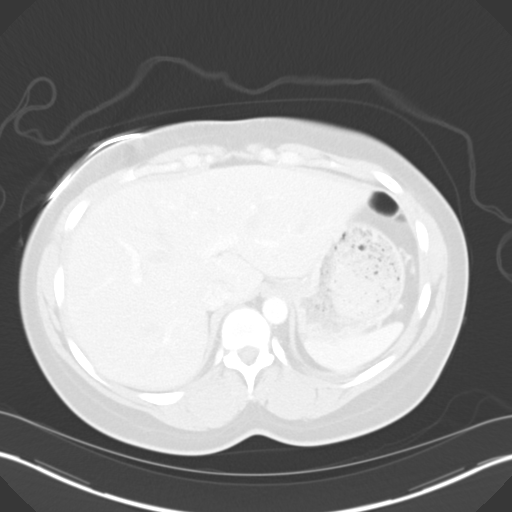
[im 80/91  soft-tissue]
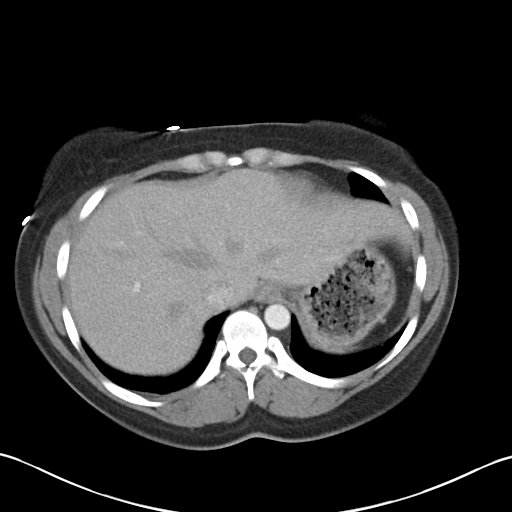
[im 80/91  lung]
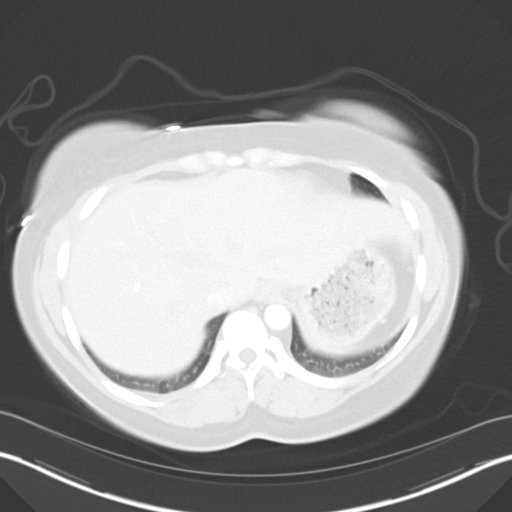
[im 83/91  lung]
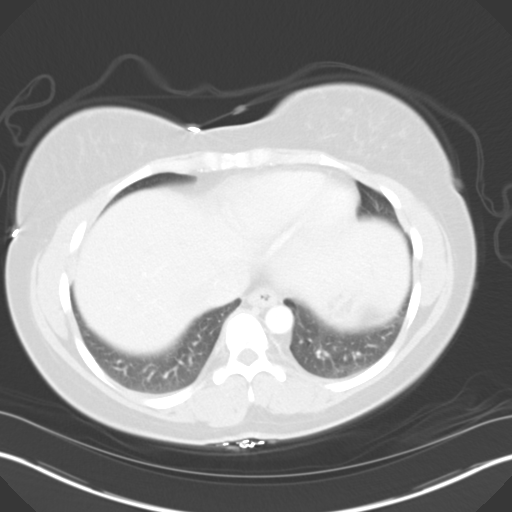
[im 87/91  soft-tissue]
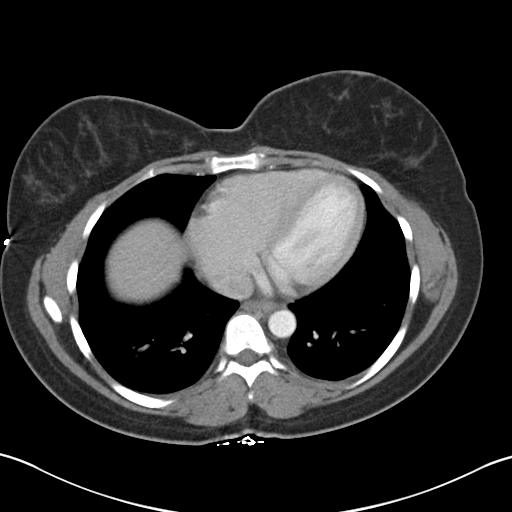
[im 87/91  lung]
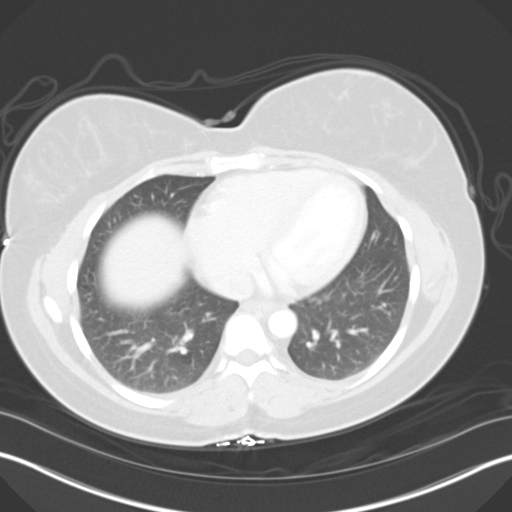

[15 of 32 positions shown; findings below may reference images not displayed]

FINDINGS: Lung bases are clear.

The liver is prominent, measuring approximately 18 cm in length.
There is a 5 mm cyst in the medial aspect of the posterior segment
right lobe of the liver near Morison's pouch. No other focal liver
lesions are identified. There is no biliary duct dilatation.
Gallbladder wall does not appear appreciably thickened.

Spleen, pancreas, and adrenals appear normal. Kidneys bilaterally
show no mass or hydronephrosis on either side. There is no renal or
ureteral calculus on either side.

There is a small ventral hernia which contains only fat.

In the pelvis, the urinary bladder is midline with normal wall
thickness. There is no pelvic mass or fluid collection. The appendix
appears normal.

There is no bowel obstruction. There is no free air or portal venous
air.

There is no ascites, adenopathy, or abscess in the abdomen or
pelvis. There is no evidence of abdominal aortic aneurysm. There are
no blastic or lytic bone lesions.
IMPRESSION: Small ventral hernia containing only fat. There is no appreciable
abdominal wall mass or inflammatory focus.

Liver is prominent with with a small cyst in the right lobe. Liver
otherwise appears unremarkable.

There is no bowel obstruction or abscess. Appendix appears normal.
No renal or ureteral calculus. No hydronephrosis.

## 2015-12-08 ENCOUNTER — Emergency Department (HOSPITAL_COMMUNITY)
Admission: EM | Admit: 2015-12-08 | Discharge: 2015-12-08 | Discharge status: Absent without leave | Payer: Medicaid Other

## 2015-12-08 ENCOUNTER — Emergency Department (HOSPITAL_COMMUNITY): Payer: Medicaid Other

## 2015-12-08 ENCOUNTER — Encounter (HOSPITAL_COMMUNITY): Payer: Self-pay | Admitting: *Deleted

## 2015-12-08 NOTE — ED Notes (Signed)
The pt is c/o a cold cough burningsensation in her chest when she coughs chills and sweating.  .  Productive cough  Green mucous and her chest burns when she colughs  Lm,p 3 weeks ago

## 2016-05-05 ENCOUNTER — Encounter: Payer: Self-pay | Admitting: *Deleted

## 2017-09-27 ENCOUNTER — Other Ambulatory Visit: Payer: Self-pay

## 2017-09-27 ENCOUNTER — Emergency Department (HOSPITAL_COMMUNITY)
Admission: EM | Admit: 2017-09-27 | Discharge: 2017-09-27 | Disposition: A | Discharge status: Home / Self Care | Payer: Medicaid Other | Attending: Emergency Medicine | Admitting: Emergency Medicine

## 2017-09-27 ENCOUNTER — Encounter (HOSPITAL_COMMUNITY): Payer: Self-pay | Admitting: Emergency Medicine

## 2017-09-27 DIAGNOSIS — L0591 Pilonidal cyst without abscess: Secondary | ICD-10-CM | POA: Insufficient documentation

## 2017-09-27 DIAGNOSIS — F17200 Nicotine dependence, unspecified, uncomplicated: Secondary | ICD-10-CM | POA: Insufficient documentation

## 2017-09-27 DIAGNOSIS — R222 Localized swelling, mass and lump, trunk: Secondary | ICD-10-CM | POA: Diagnosis present

## 2017-09-27 MED ORDER — OXYCODONE-ACETAMINOPHEN 5-325 MG PO TABS
1.0000 | ORAL_TABLET | Freq: Once | ORAL | Status: AC
  Administered 2017-09-27: 1 via ORAL
  Filled 2017-09-27: qty 1

## 2017-09-27 MED ORDER — LIDOCAINE HCL (PF) 1 % IJ SOLN
5.0000 mL | Freq: Once | INTRAMUSCULAR | Status: DC
  Filled 2017-09-27: qty 30

## 2017-09-27 MED ORDER — AMOXICILLIN-POT CLAVULANATE 875-125 MG PO TABS: 1.0000 | ORAL_TABLET | Freq: Two times a day (BID) | ORAL | 0 refills | Status: DC

## 2017-09-27 MED ORDER — LIDOCAINE-EPINEPHRINE-TETRACAINE (LET) SOLUTION
3.0000 mL | Freq: Once | NASAL | Status: AC
  Administered 2017-09-27: 3 mL via TOPICAL
  Filled 2017-09-27: qty 3

## 2017-09-27 NOTE — ED Provider Notes (Signed)
[facility]-EMERGENCY DEPT Provider Note   CSN: [id number] Arrival date & time: 09/27/17  0019     History   Chief Complaint Chief Complaint  Patient presents with  . Abscess    HPI Nancy Swanson is a 32 y.o. female.  The history is provided by the patient and medical records.  Abscess     32 year old female with history of acid reflux and Bartholin cyst, presenting to the ED with abscess.  Patient reports she feels a "cyst" on her tailbone.  She first noticed this 3 days ago but has gotten bigger in size and become more painful since then.  She reports she felt like it was going to drain because it has a "head" on it but it is not started doing so.  She denies any fever or chills.  States she is just having a lot of pain especially when walking or trying to sit and she had to call out of work today.  She does have a history of pilonidal cyst in the past but this started draining spontaneously.  Past Medical History:  Diagnosis Date  . Acid reflux   . Bartholin cyst     Patient Active Problem List   Diagnosis Date Noted  . Previous cesarean section complicating pregnancy 05/23/2011  . Cesarean delivery, without mention of indication, delivered, with or without mention of antepartum condition 05/23/2011  . Vulval cellulitis 04/12/2011  . Normal pregnancy 04/12/2011    Past Surgical History:  Procedure Laterality Date  . CESAREAN SECTION    . CESAREAN SECTION  05/23/2011   Procedure: CESAREAN SECTION;  Surgeon: Roseanna RainbowLisa A Jackson-Moore, MD;  Location: [facility];  Service: Gynecology;  Laterality: N/A;  Repeat cesarean section with delivery of baby boy at [id number]. Apgars 9/9.  Marland Kitchen. CYSTECTOMY      glands removed from bil axilla due to cyst formation  . sweat glands     removed from under arms    OB History    Gravida Para Term Preterm AB Living   2 2 2  0 0 2   SAB TAB Ectopic Multiple Live Births   0 0 0 0 2       Home Medications    Prior to Admission  medications   Medication Sig Start Date End Date Taking? Authorizing Provider  ibuprofen (ADVIL,MOTRIN) 200 MG tablet Take 400 mg by mouth every 6 (six) hours as needed for headache, mild pain or moderate pain.    Yes [provider]  oxyCODONE-acetaminophen (PERCOCET/ROXICET) 5-325 MG tablet Take 1 tablet by mouth once.   Yes [provider]    Family History History reviewed. No pertinent family history.  Social History Social History   Tobacco Use  . Smoking status: Current Every Day Smoker    Packs/day: 0.25  . Smokeless tobacco: Never Used  Substance Use Topics  . Alcohol use: No  . Drug use: No     Allergies   Patient has no known allergies.   Review of Systems Review of Systems  Skin:       abscess  All other systems reviewed and are negative.    Physical Exam Updated Vital Signs BP (!) 127/97 (BP Location: Left Arm)   Pulse 79   Temp 98.8 F (37.1 C) (Oral)   Resp 16   Ht 5\' 3"  (1.6 m)   Wt 79.4 kg (175 lb)   LMP 09/05/2017 (Approximate)   SpO2 100%   BMI 31.00 kg/m   Physical  Exam  Constitutional: She is oriented to person, place, and time. She appears well-developed and well-nourished.  HENT:  Head: Normocephalic and atraumatic.  Mouth/Throat: Oropharynx is clear and moist.  Eyes: Conjunctivae and EOM are normal. Pupils are equal, round, and reactive to light.  Neck: Normal range of motion.  Cardiovascular: Normal rate, regular rhythm and normal heart sounds.  Pulmonary/Chest: Effort normal and breath sounds normal. No stridor. No respiratory distress.  Abdominal: Soft. Bowel sounds are normal.  Genitourinary:  Genitourinary Comments: Pilonidal cyst present on the gluteal cleft, does appear to be a head but there is no active drainage, central fluctuance noted  Musculoskeletal: Normal range of motion.  Neurological: She is alert and oriented to person, place, and time.  Skin: Skin is warm and dry.  Psychiatric: She has a  normal mood and affect.  Nursing note and vitals reviewed.    ED Treatments / Results  Labs (all labs ordered are listed, but only abnormal results are displayed) Labs Reviewed - No data to display  EKG  EKG Interpretation None       Radiology No results found.  Procedures Procedures (including critical care time)  INCISION AND DRAINAGE Performed by: Garlon Hatchet Consent: Verbal consent obtained. Risks and benefits: risks, benefits and alternatives were discussed Type: abscess  Body area: gluteal cleft   Anesthesia: topical  Spontaneously starting draining  Local anesthetic: none  Anesthetic total: 3 ml topical LET  Complexity: complex Blunt dissection to break up loculations  Drainage: purulent  Drainage amount: large  Packing material: none  Patient tolerance: Patient tolerated the procedure well with no immediate complications.    Medications Ordered in ED Medications  lidocaine (PF) (XYLOCAINE) 1 % injection 5 mL (0 mLs Intradermal Hold 09/27/17 0506)  lidocaine-EPINEPHrine-tetracaine (LET) solution (3 mLs Topical Given 09/27/17 0506)  oxyCODONE-acetaminophen (PERCOCET/ROXICET) 5-325 MG per tablet 1 tablet (1 tablet Oral Given 09/27/17 0505)     Initial Impression / Assessment and Plan / ED Course  I have reviewed the triage vital signs and the nursing notes.  Pertinent labs & imaging results that were available during my care of the patient were reviewed by me and considered in my medical decision making (see chart for details).  32 year old female presenting to the ED with pilonidal cyst.  She has a history of same.  On exam, 1 cm abscess noted to the gluteal cleft.  There is central fluctuance without active drainage.  Plan for I&D.  Topical let applied and patient given dose of pain medication.  Have gone back into room to drain cyst and it has spontaneously started draining.  I was able to help expel a large amount of purulent material, very  foul-smelling.  Patient reports she feels significantly better.  Will start on abx.  Discussed warm compresses/soaks at home.  Close monitoring and follow-up with PCP.  She understands to return here for any new/acute changes.  Final Clinical Impressions(s) / ED Diagnoses   Final diagnoses:  Pilonidal cyst    ED Discharge Orders        Ordered    amoxicillin-clavulanate (AUGMENTIN) 875-125 MG tablet  Every 12 hours     09/27/17 0552       Garlon Hatchet, PA-C 09/27/17 0601    Dione Booze, MD 09/27/17 [phone]

## 2017-09-27 NOTE — ED Triage Notes (Signed)
Pt states she has a cyst on her tailbone  Pt states she has had one before  This one came up about 3 days ago and has progressively gotten worse   Pt states she took a percocet earlier for the pain but still rates it an 8

## 2017-09-27 NOTE — Discharge Instructions (Signed)
Take the prescribed medication as directed.  Warm soaks/compresses at home. Follow-up with your primary care doctor if any ongoing issues. Return to the ED for new or worsening symptoms.

## 2020-09-23 ENCOUNTER — Encounter (HOSPITAL_BASED_OUTPATIENT_CLINIC_OR_DEPARTMENT_OTHER): Payer: Self-pay | Admitting: Physical Therapy

## 2020-09-23 ENCOUNTER — Other Ambulatory Visit: Payer: Self-pay

## 2020-09-23 ENCOUNTER — Ambulatory Visit (HOSPITAL_BASED_OUTPATIENT_CLINIC_OR_DEPARTMENT_OTHER): Payer: Medicaid Other | Attending: Orthopedic Surgery | Admitting: Physical Therapy

## 2020-09-23 DIAGNOSIS — M542 Cervicalgia: Secondary | ICD-10-CM

## 2020-09-23 DIAGNOSIS — R293 Abnormal posture: Secondary | ICD-10-CM | POA: Insufficient documentation

## 2020-09-23 DIAGNOSIS — R208 Other disturbances of skin sensation: Secondary | ICD-10-CM | POA: Insufficient documentation

## 2020-09-23 DIAGNOSIS — M5412 Radiculopathy, cervical region: Secondary | ICD-10-CM | POA: Diagnosis not present

## 2020-09-23 DIAGNOSIS — M6281 Muscle weakness (generalized): Secondary | ICD-10-CM | POA: Insufficient documentation

## 2020-09-23 NOTE — Therapy (Signed)
[facility] [address] [place], [place], [address] Phone: [phone]   Fax:  [phone]  Physical Therapy Evaluation  Patient Details  Name: Nancy Swanson MRN: [id number] Date of Birth: [birth year] Referring Provider (PT): Pati Gallo, MD   Encounter Date: 09/23/2020   PT End of Session - 09/23/20 1014    Visit Number 1    Number of Visits 13    Date for PT Re-Evaluation 11/04/20    Authorization Type [employer] -- needs initial auth    Authorization - Visit Number 1    Authorization - Number of Visits 4    PT Start Time 1017    PT Stop Time 1104    PT Time Calculation (min) 47 min    Activity Tolerance Patient tolerated treatment well    Behavior During Therapy [facility] for tasks assessed/performed           Past Medical History:  Diagnosis Date  . Acid reflux   . Bartholin cyst     Past Surgical History:  Procedure Laterality Date  . CESAREAN SECTION    . CESAREAN SECTION  05/23/2011   Procedure: CESAREAN SECTION;  Surgeon: Roseanna Rainbow, MD;  Location: [facility];  Service: Gynecology;  Laterality: N/A;  Repeat cesarean section with delivery of baby boy at 38. Apgars 9/9.  Marland Kitchen CYSTECTOMY      glands removed from bil axilla due to cyst formation  . sweat glands     removed from under arms    There were no vitals filed for this visit.    Subjective Assessment - 09/23/20 1021    Subjective Pt reports L side neck pain with tingling down arm. Pt states the neck part has gotten worse. Pt states she was told she had spasms and a pinched nerve. Pt states she is unable to have MRI until PT. Pt tends to sleep on her stomach but does lay on her sides. Pt reports discomfort laying on right side and letting L arm hang forward. Pt states it's been going over 6 months. No mechanism of injury noted. 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    How long can you sit  comfortably? n/a    How long can you stand comfortably? n/a    How long can you walk comfortably? n/a    Patient Stated Goals Improve pain    Currently in Pain? Yes    Pain Score 0-No pain   At worst 8/10   Pain Location Neck    Pain Orientation Left    Pain Descriptors / Indicators Aching;Sore;Guarding;Discomfort   when she feels the pain it can last a few hours   Pain Type Acute pain    Pain Radiating Towards lateral L shoulder/arm    Aggravating Factors  After a long day (pt is unsure)    Pain Relieving Factors Medication    Effect of Pain on Daily Activities Decreased neck movement for driving and comfort with ADLs              [facility] PT Assessment - 09/23/20 0001      Assessment   Medical Diagnosis Cervical strain    Referring Provider (PT) Pati Gallo, MD    Onset Date/Surgical Date --   ~6 months ago   Prior Therapy None      Balance Screen   Has the patient fallen in the past 6 months No  Home Environment   Living Environment Private residence    Living Arrangements Children    Available Help at Discharge Family      Prior Function   Level of Independence Independent    Vocation Full time employment    Leisure Gardening      Sensation   Light Touch Appears Intact;Impaired by gross assessment    Additional Comments C4-C6 impaired with myotome testing      Posture/Postural Control   Posture/Postural Control Postural limitations    Postural Limitations Rounded Shoulders;Forward head      AROM   Overall AROM Comments all shoulder ROM WFL    Right/Left Shoulder Left    Cervical Flexion 30    Cervical Extension 30   Increased pain and pinching on left   Cervical - Right Side Bend 45    Cervical - Left Side Bend 35   Feels pinch/tightening on L side of neck   Cervical - Right Rotation 52    Cervical - Left Rotation 45   Increased pain     Strength   Strength Assessment Site Shoulder   Pt with reports of chronic wrist/hand issues; wrist/hand grossly  4/5 bilat   Left Shoulder Flexion 5/5    Left Shoulder Extension 4/5    Left Shoulder External Rotation 3+/5    Left Elbow Flexion 4/5    Left Elbow Extension 3+/5      Palpation   Spinal mobility Hypomobile along C3-C6 with reproducible symptoms upon L cervical rotation, R side gliding and AP springing into extension    Palpation comment Shortened with multiple trigger points along L scalene, upper traps and splenius muscles (mostly along insertions on posterior mid cervical spine)      Special Tests    Special Tests Cervical      Spurling's   Findings Positive    Side Left      Distraction Test   Findngs Positive    Comment Reports feeling stretch on L side of neck                      Objective measurements completed on examination: See above findings.       [facility] Adult PT Treatment/Exercise - 09/23/20 0001      Neck Exercises: Stretches   Upper Trapezius Stretch Left;30 seconds    Other Neck Stretches Towel L scalene stretch x30 sec    Other Neck Stretches Towel stretch with R neck rotation x30 sec                  PT Education - 09/23/20 1130    Education Details Discussed exam findings, POC, and initiating stretching    Person(s) Educated Patient    Methods Explanation;Verbal cues;Handout    Comprehension Verbalized understanding;Returned demonstration;Tactile cues required            PT Short Term Goals - 09/23/20 1148      PT SHORT TERM GOAL #1   Title Pt will be independent with initial HEP    Baseline Newly provided    Time 3    Period Weeks    Status New    Target Date 10/14/20      PT SHORT TERM GOAL #2   Title Pt will have improved L side neck ROM by at least 5 deg in all directions    Baseline 30 deg extension, 35 deg side flexion, 45 deg rotation    Time 3    Period Weeks  Status New    Target Date 10/14/20      PT SHORT TERM GOAL #3   Title Pt will report decrease in pain frequency and duration by 50%     Baseline At worst 8/10 pain    Time 3    Period Weeks    Status New    Target Date 10/14/20             PT Long Term Goals - 09/23/20 1154      PT LONG TERM GOAL #1   Title Pt will be independent with self managing symptoms    Time 6    Period Weeks    Status New    Target Date 10/14/20      PT LONG TERM GOAL #2   Title Pt will have equal L and R neck ROM bilat    Baseline Currently less movement with L neck movement    Time 6    Period Weeks    Status New    Target Date 11/04/20      PT LONG TERM GOAL #3   Title Pt will report </=2/10 pain at most with equal and full sensation between L & R UEs    Baseline 8/10 at worst; L C3-C6 dermatomes decreased vs R    Time 6    Period Weeks    Status New    Target Date 11/04/20      PT LONG TERM GOAL #4   Title Pt will report return to PLOF    Time 6    Period Weeks    Status New    Target Date 11/04/20                  Plan - 09/23/20 1131    Clinical Impression Statement Pt is a 35 y/o F presenting to OPPT due to complaint of worsening L side neck pain within the last 6 months. Pt with no other significant PMH. On assessment, pt demos decreased cervical ROM (L worse than R), decreased sensation along L C3-C6 dermatomes with L UE > R UE weakness. Spurling test is (+) for neck radiculopathy, with Pt tender to palpation with multiple trigger points along L posterior mid cervical spine and increased symptoms in positions decreasing facet joint articulation. S/S appear most consistent with facet joint dysfunction and related L neck muscle shortening. Pt would benefit from PT to improve her pain and ROM for increased freedom of movement with driving, occupational, and community tasks.    Personal Factors and Comorbidities Age    Examination-Activity Limitations Sleep;Hygiene/Grooming    Examination-Participation Restrictions Occupation;Driving;Community Activity;Cleaning    Stability/Clinical Decision Making  Stable/Uncomplicated    Clinical Decision Making Low    Rehab Potential Good    PT Frequency 2x / week    PT Duration 6 weeks    PT Treatment/Interventions ADLs/Self Care Home Management;Biofeedback;Cryotherapy;Electrical Stimulation;Moist Heat;Iontophoresis 4mg /ml Dexamethasone;DME Instruction;Ultrasound;Traction;Functional mobility training;Therapeutic activities;Therapeutic exercise;Balance training;Neuromuscular re-education;Manual techniques;Patient/family education;Passive range of motion;Dry needling;Taping;Spinal Manipulations;Joint Manipulations    PT Next Visit Plan Assess response to HEP and modify as needed. Manual therapy to address muscle tightness and improve facet joint articulation. Begin neck and scapular strengthening. Consider mulligan technique    PT Home Exercise Plan Access Code: [email or web address]    Consulted and Agree with Plan of Care Patient           Patient will benefit from skilled therapeutic intervention in order to improve the following deficits and impairments:  Decreased range of motion,Increased  fascial restricitons,Increased muscle spasms,Pain,Hypomobility,Improper body mechanics,Decreased mobility,Decreased strength,Impaired sensation,Postural dysfunction  Visit Diagnosis: Cervicalgia  Radiculopathy, cervical region  Other disturbances of skin sensation  Abnormal posture  Muscle weakness (generalized)     Problem List Patient Active Problem List   Diagnosis Date Noted  . Previous cesarean section complicating pregnancy 05/23/2011  . Cesarean delivery, without mention of indication, delivered, with or without mention of antepartum condition 05/23/2011  . Vulval cellulitis 04/12/2011  . Normal pregnancy 04/12/2011    [facility] April Ma L Skylor Hughson PT, DPT 09/23/2020, 11:57 AM  [facility] [address] [place], [place], [address] Phone: [phone]   Fax:  [phone]  Name: Nancy Swanson MRN:  [id number] Date of Birth: 1986/04/24

## 2020-09-23 NOTE — Patient Instructions (Signed)
Access Code: [email or web address] URL: [email or web address] Date: 09/23/2020 Prepared by: Vernon Prey April Kirstie Peri  Exercises Seated Scalene Stretch with Towel - 1 x daily - 7 x weekly - 2 sets - 20-30 sec hold Seated Upper Trapezius Stretch - 1 x daily - 7 x weekly - 2 sets - 20-30 sec hold Seated Assisted Cervical Rotation with Towel - 1 x daily - 7 x weekly - 2 sets - 20-30 sec hold

## 2020-09-26 ENCOUNTER — Ambulatory Visit (HOSPITAL_BASED_OUTPATIENT_CLINIC_OR_DEPARTMENT_OTHER): Payer: Medicaid Other | Attending: Sports Medicine | Admitting: Physical Therapy

## 2020-09-30 ENCOUNTER — Ambulatory Visit (HOSPITAL_BASED_OUTPATIENT_CLINIC_OR_DEPARTMENT_OTHER): Payer: Medicaid Other | Attending: Sports Medicine | Admitting: Physical Therapy

## 2020-09-30 ENCOUNTER — Other Ambulatory Visit: Payer: Self-pay

## 2020-09-30 DIAGNOSIS — M542 Cervicalgia: Secondary | ICD-10-CM | POA: Diagnosis not present

## 2020-09-30 DIAGNOSIS — M5412 Radiculopathy, cervical region: Secondary | ICD-10-CM | POA: Insufficient documentation

## 2020-09-30 DIAGNOSIS — R208 Other disturbances of skin sensation: Secondary | ICD-10-CM | POA: Diagnosis present

## 2020-09-30 DIAGNOSIS — M6281 Muscle weakness (generalized): Secondary | ICD-10-CM | POA: Diagnosis present

## 2020-09-30 DIAGNOSIS — R293 Abnormal posture: Secondary | ICD-10-CM | POA: Diagnosis present

## 2020-09-30 NOTE — Therapy (Signed)
[facility] [address] [place], [place], [address] Phone: [phone]   Fax:  [phone]  Physical Therapy Treatment  Patient Details  Name: Nancy Swanson MRN: [id number] Date of Birth: 1985-12-17 Referring Provider (PT): Pati Gallo, MD   Encounter Date: 09/30/2020   PT End of Session - 09/30/20 0848    Visit Number 2    Number of Visits 13    Date for PT Re-Evaluation 11/04/20    Authorization Type [employer] -- needs initial auth    Authorization - Visit Number 2    Authorization - Number of Visits 4    PT Start Time 0801    PT Stop Time 0848    PT Time Calculation (min) 47 min    Activity Tolerance Patient tolerated treatment well    Behavior During Therapy [facility] for tasks assessed/performed           Past Medical History:  Diagnosis Date  . Acid reflux   . Bartholin cyst     Past Surgical History:  Procedure Laterality Date  . CESAREAN SECTION    . CESAREAN SECTION  05/23/2011   Procedure: CESAREAN SECTION;  Surgeon: Roseanna Rainbow, MD;  Location: [facility];  Service: Gynecology;  Laterality: N/A;  Repeat cesarean section with delivery of baby boy at 22. Apgars 9/9.  Marland Kitchen CYSTECTOMY      glands removed from bil axilla due to cyst formation  . sweat glands     removed from under arms    There were no vitals filed for this visit.   Subjective Assessment - 09/30/20 0802    Subjective Pt states that she feels that the stretches can flare things up. Pt states she has not been feeling any of the tingling.    How long can you sit comfortably? n/a    How long can you stand comfortably? n/a    How long can you walk comfortably? n/a    Patient Stated Goals Improve pain    Currently in Pain? No/denies    Pain Location Neck    Pain Orientation Left                             [facility] Adult PT Treatment/Exercise - 09/30/20 0001      Neck Exercises: Seated   Neck Retraction 10  reps;3 secs   visual cues to decrease mild R head tilt   Other Seated Exercise scapular retraction 10 x 3 sec hold      Neck Exercises: Supine   Capital Flexion 10 secs   3 reps     Neck Exercises: Prone   Other Prone Exercise Scapular retraction x10      Modalities   Modalities Traction      Traction   Type of Traction Cervical    Min (lbs) 5    Max (lbs) 15    Hold Time 3 min    Rest Time 1 min    Time 10 min      Manual Therapy   Manual Therapy Joint mobilization;Manual Traction    Joint Mobilization PA cervical and thoracic grade III mobs. L cervical side glide for L lateral neck flexion grade III. R & L cervical transverse process rotation grade III    Manual Traction 2x10 sec      Neck Exercises: Stretches   Upper Trapezius Stretch Left;20 seconds    Other Neck Stretches Towel L scalene stretch  x20 sec    Other Neck Stretches Towel stretch with R neck rotation 2x10 sec                  PT Education - 09/30/20 0843    Education Details Discussed proper form while performing HEP. Discussed cervical anatomy and opening joints to decrease pressure on nerves.    Person(s) Educated Patient    Methods Explanation;Verbal cues;Handout;Demonstration    Comprehension Verbalized understanding;Returned demonstration;Tactile cues required;Verbal cues required            PT Short Term Goals - 09/23/20 1148      PT SHORT TERM GOAL #1   Title Pt will be independent with initial HEP    Baseline Newly provided    Time 3    Period Weeks    Status New    Target Date 10/14/20      PT SHORT TERM GOAL #2   Title Pt will have improved L side neck ROM by at least 5 deg in all directions    Baseline 30 deg extension, 35 deg side flexion, 45 deg rotation    Time 3    Period Weeks    Status New    Target Date 10/14/20      PT SHORT TERM GOAL #3   Title Pt will report decrease in pain frequency and duration by 50%    Baseline At worst 8/10 pain    Time 3    Period  Weeks    Status New    Target Date 10/14/20             PT Long Term Goals - 09/23/20 1154      PT LONG TERM GOAL #1   Title Pt will be independent with self managing symptoms    Time 6    Period Weeks    Status New    Target Date 10/14/20      PT LONG TERM GOAL #2   Title Pt will have equal L and R neck ROM bilat    Baseline Currently less movement with L neck movement    Time 6    Period Weeks    Status New    Target Date 11/04/20      PT LONG TERM GOAL #3   Title Pt will report </=2/10 pain at most with equal and full sensation between L & R UEs    Baseline 8/10 at worst; L C3-C6 dermatomes decreased vs R    Time 6    Period Weeks    Status New    Target Date 11/04/20      PT LONG TERM GOAL #4   Title Pt will report return to PLOF    Time 6    Period Weeks    Status New    Target Date 11/04/20                 Plan - 09/30/20 0844    Clinical Impression Statement Treatment focused on reviewing HEP for proper form as this may be the reason why she feels a little flare up with the stretches. Manual therapy and traction provided to address pt's hypomobility -- less cervical hypomobility noted this session from initial eval with less trigger points noted. Initiated gentle cervical and thoracic strengthening via scapular and cervical retractions. Pt given visual cues as she tends to have a mild cervical side tilt to the right. Pt tolerated session well.    Personal Factors and Comorbidities Age  Examination-Activity Limitations Sleep;Hygiene/Grooming    Examination-Participation Restrictions Occupation;Driving;Community Activity;Cleaning    Stability/Clinical Decision Making Stable/Uncomplicated    Rehab Potential Good    PT Frequency 2x / week    PT Duration 6 weeks    PT Treatment/Interventions ADLs/Self Care Home Management;Biofeedback;Cryotherapy;Electrical Stimulation;Moist Heat;Iontophoresis 4mg /ml Dexamethasone;DME  Instruction;Ultrasound;Traction;Functional mobility training;Therapeutic activities;Therapeutic exercise;Balance training;Neuromuscular re-education;Manual techniques;Patient/family education;Passive range of motion;Dry needling;Taping;Spinal Manipulations;Joint Manipulations    PT Next Visit Plan Assess response to HEP and modify as needed. Manual therapy to address muscle tightness and improve facet joint articulation. Begin neck and scapular strengthening. Begin cervical isometrics    PT Home Exercise Plan Access Code: [email or web address]    Consulted and Agree with Plan of Care Patient           Patient will benefit from skilled therapeutic intervention in order to improve the following deficits and impairments:  Decreased range of motion,Increased fascial restricitons,Increased muscle spasms,Pain,Hypomobility,Improper body mechanics,Decreased mobility,Decreased strength,Impaired sensation,Postural dysfunction  Visit Diagnosis: Cervicalgia  Radiculopathy, cervical region  Other disturbances of skin sensation  Abnormal posture  Muscle weakness (generalized)     Problem List Patient Active Problem List   Diagnosis Date Noted  . Previous cesarean section complicating pregnancy 05/23/2011  . Cesarean delivery, without mention of indication, delivered, with or without mention of antepartum condition 05/23/2011  . Vulval cellulitis 04/12/2011  . Normal pregnancy 04/12/2011    [facility] April Ma L Antavion Bartoszek PT, DPT 09/30/2020, 8:55 AM  [facility] [address] [place], [place], [place] Phone: [phone]   Fax:  [phone]  Name: Nancy Swanson MRN: Michela Pitcher Date of Birth: 04-Dec-1985

## 2020-10-04 ENCOUNTER — Ambulatory Visit (HOSPITAL_BASED_OUTPATIENT_CLINIC_OR_DEPARTMENT_OTHER): Payer: Medicaid Other | Admitting: Physical Therapy

## 2020-10-07 ENCOUNTER — Other Ambulatory Visit: Payer: Self-pay

## 2020-10-07 ENCOUNTER — Ambulatory Visit (HOSPITAL_BASED_OUTPATIENT_CLINIC_OR_DEPARTMENT_OTHER): Payer: Medicaid Other | Admitting: Physical Therapy

## 2020-10-07 DIAGNOSIS — M6281 Muscle weakness (generalized): Secondary | ICD-10-CM

## 2020-10-07 DIAGNOSIS — M542 Cervicalgia: Secondary | ICD-10-CM

## 2020-10-07 DIAGNOSIS — R208 Other disturbances of skin sensation: Secondary | ICD-10-CM

## 2020-10-07 DIAGNOSIS — R293 Abnormal posture: Secondary | ICD-10-CM

## 2020-10-07 DIAGNOSIS — M5412 Radiculopathy, cervical region: Secondary | ICD-10-CM

## 2020-10-07 NOTE — Therapy (Signed)
[facility] [address] [place], [place], [address] Phone: [phone]   Fax:  [phone]  Physical Therapy Treatment  Patient Details  Name: Nancy Swanson MRN: [id number] Date of Birth: Mar 01, 1986 Referring Provider (PT): Pati Gallo, MD   Encounter Date: 10/07/2020   PT End of Session - 10/07/20 0847    Visit Number 3    Number of Visits 13    Date for PT Re-Evaluation 11/04/20    Authorization Type [employer] -- needs initial auth    Authorization - Visit Number 3    Authorization - Number of Visits 4    PT Start Time 0802    PT Stop Time 0843    PT Time Calculation (min) 41 min    Activity Tolerance Patient tolerated treatment well    Behavior During Therapy [facility] for tasks assessed/performed           Past Medical History:  Diagnosis Date  . Acid reflux   . Bartholin cyst     Past Surgical History:  Procedure Laterality Date  . CESAREAN SECTION    . CESAREAN SECTION  05/23/2011   Procedure: CESAREAN SECTION;  Surgeon: Roseanna Rainbow, MD;  Location: [facility];  Service: Gynecology;  Laterality: N/A;  Repeat cesarean section with delivery of baby boy at 50. Apgars 9/9.  Marland Kitchen CYSTECTOMY      glands removed from bil axilla due to cyst formation  . sweat glands     removed from under arms    There were no vitals filed for this visit.   Subjective Assessment - 10/07/20 0809    Subjective Pt states that she felt relief for ~1 hr after the traction machine but felt a flare up again afterward. Pt reports no other instances of flare up. Pt states that she's been sleeping with a neck pillow laying on her right side and it has been helpful. Pt has not been feeling any radiculopathy.    How long can you sit comfortably? n/a    How long can you stand comfortably? n/a    How long can you walk comfortably? n/a    Patient Stated Goals Improve pain    Currently in Pain? No/denies              [facility] PT  Assessment - 10/07/20 0001      AROM   Cervical Flexion 30    Cervical Extension 35   Feels pinch   Cervical - Right Side Bend 45    Cervical - Left Side Bend 45    Cervical - Right Rotation 45    Cervical - Left Rotation 45   Feels pinch                        [facility] Adult PT Treatment/Exercise - 10/07/20 0001      Neck Exercises: Machines for Strengthening   UBE (Upper Arm Bike) L1 x 3 min forward and then backward      Neck Exercises: Standing   Other Standing Exercises Extensions, rotation, and side bending isometrics against wall 5x5 sec each      Neck Exercises: Supine   Cervical Isometrics Flexion;Right lateral flexion;Left lateral flexion;Right rotation;Left rotation;5 secs;5 reps    Neck Retraction 5 reps;5 secs      Manual Therapy   Manual Therapy Passive ROM    Joint Mobilization R & L cervical transverse process rotation grade III    Passive ROM Gentle  rotation PROM bilat    Manual Traction 2x10 sec                    PT Short Term Goals - 09/23/20 1148      PT SHORT TERM GOAL #1   Title Pt will be independent with initial HEP    Baseline Newly provided    Time 3    Period Weeks    Status New    Target Date 10/14/20      PT SHORT TERM GOAL #2   Title Pt will have improved L side neck ROM by at least 5 deg in all directions    Baseline 30 deg extension, 35 deg side flexion, 45 deg rotation    Time 3    Period Weeks    Status New    Target Date 10/14/20      PT SHORT TERM GOAL #3   Title Pt will report decrease in pain frequency and duration by 50%    Baseline At worst 8/10 pain    Time 3    Period Weeks    Status New    Target Date 10/14/20             PT Long Term Goals - 09/23/20 1154      PT LONG TERM GOAL #1   Title Pt will be independent with self managing symptoms    Time 6    Period Weeks    Status New    Target Date 10/14/20      PT LONG TERM GOAL #2   Title Pt will have equal L and R neck ROM bilat     Baseline Currently less movement with L neck movement    Time 6    Period Weeks    Status New    Target Date 11/04/20      PT LONG TERM GOAL #3   Title Pt will report </=2/10 pain at most with equal and full sensation between L & R UEs    Baseline 8/10 at worst; L C3-C6 dermatomes decreased vs R    Time 6    Period Weeks    Status New    Target Date 11/04/20      PT LONG TERM GOAL #4   Title Pt will report return to PLOF    Time 6    Period Weeks    Status New    Target Date 11/04/20                 Plan - 10/07/20 0848    Clinical Impression Statement Pt has been more mindful of her general posture and neck alignment. Pt with improved side bending without "pinching" sensation (AROM now equal to her R side). Rotation and extension remain more limited on the left. Treatment session thus focused on continuing to improve her extension and rotation. Initiated gentle isometric strengthening for her neck as her alignment has continued to improve.    Personal Factors and Comorbidities Age    Examination-Activity Limitations Sleep;Hygiene/Grooming    Examination-Participation Restrictions Occupation;Driving;Community Activity;Cleaning    Stability/Clinical Decision Making Stable/Uncomplicated    Rehab Potential Good    PT Frequency 2x / week    PT Duration 6 weeks    PT Treatment/Interventions ADLs/Self Care Home Management;Biofeedback;Cryotherapy;Electrical Stimulation;Moist Heat;Iontophoresis 4mg /ml Dexamethasone;DME Instruction;Ultrasound;Traction;Functional mobility training;Therapeutic activities;Therapeutic exercise;Balance training;Neuromuscular re-education;Manual techniques;Patient/family education;Passive range of motion;Dry needling;Taping;Spinal Manipulations;Joint Manipulations    PT Next Visit Plan Assess response to HEP and modify as  needed. Manual therapy to address muscle tightness and improve facet joint articulation. Increase scapular/shoulder strengthening. Begin  cervical isometrics    PT Home Exercise Plan Access Code: [email or web address]    Consulted and Agree with Plan of Care Patient           Patient will benefit from skilled therapeutic intervention in order to improve the following deficits and impairments:  Decreased range of motion,Increased fascial restricitons,Increased muscle spasms,Pain,Hypomobility,Improper body mechanics,Decreased mobility,Decreased strength,Impaired sensation,Postural dysfunction  Visit Diagnosis: Cervicalgia  Radiculopathy, cervical region  Other disturbances of skin sensation  Abnormal posture  Muscle weakness (generalized)     Problem List Patient Active Problem List   Diagnosis Date Noted  . Previous cesarean section complicating pregnancy 05/23/2011  . Cesarean delivery, without mention of indication, delivered, with or without mention of antepartum condition 05/23/2011  . Vulval cellulitis 04/12/2011  . Normal pregnancy 04/12/2011    [facility] April Ma L Triniti Gruetzmacher PT, DPT 10/07/2020, 8:51 AM  [facility] [address] [place], [place], [address] Phone: [phone]   Fax:  [phone]  Name: Nancy Swanson MRN: [id number] Date of Birth: 1986-05-23

## 2020-10-11 ENCOUNTER — Ambulatory Visit (HOSPITAL_BASED_OUTPATIENT_CLINIC_OR_DEPARTMENT_OTHER): Payer: Medicaid Other | Admitting: Physical Therapy

## 2020-10-14 ENCOUNTER — Ambulatory Visit (HOSPITAL_BASED_OUTPATIENT_CLINIC_OR_DEPARTMENT_OTHER): Payer: Medicaid Other | Attending: Orthopedic Surgery | Admitting: Physical Therapy

## 2020-10-14 DIAGNOSIS — R208 Other disturbances of skin sensation: Secondary | ICD-10-CM | POA: Insufficient documentation

## 2020-10-14 DIAGNOSIS — M6281 Muscle weakness (generalized): Secondary | ICD-10-CM | POA: Insufficient documentation

## 2020-10-14 DIAGNOSIS — M5412 Radiculopathy, cervical region: Secondary | ICD-10-CM | POA: Insufficient documentation

## 2020-10-14 DIAGNOSIS — R293 Abnormal posture: Secondary | ICD-10-CM | POA: Insufficient documentation

## 2020-10-14 DIAGNOSIS — M542 Cervicalgia: Secondary | ICD-10-CM | POA: Insufficient documentation

## 2020-10-18 ENCOUNTER — Other Ambulatory Visit: Payer: Self-pay

## 2020-10-18 ENCOUNTER — Ambulatory Visit (HOSPITAL_BASED_OUTPATIENT_CLINIC_OR_DEPARTMENT_OTHER): Payer: Medicaid Other | Admitting: Physical Therapy

## 2020-10-18 DIAGNOSIS — M5412 Radiculopathy, cervical region: Secondary | ICD-10-CM | POA: Diagnosis present

## 2020-10-18 DIAGNOSIS — M6281 Muscle weakness (generalized): Secondary | ICD-10-CM

## 2020-10-18 DIAGNOSIS — R208 Other disturbances of skin sensation: Secondary | ICD-10-CM

## 2020-10-18 DIAGNOSIS — R293 Abnormal posture: Secondary | ICD-10-CM

## 2020-10-18 DIAGNOSIS — M542 Cervicalgia: Secondary | ICD-10-CM | POA: Diagnosis present

## 2020-10-18 NOTE — Therapy (Signed)
[name] [address] [place], [place], [address] Phone: [phone]   Fax:  [phone]  Physical Therapy Treatment  Patient Details  Name: Nancy Swanson MRN: [id number] Date of Birth: October 01, 1985 Referring Provider (PT): Berle Mull, MD   Encounter Date: 10/18/2020   PT End of Session - 10/18/20 0843    Visit Number 4    Number of Visits 13    Date for PT Re-Evaluation 11/04/20    Authorization Type [employer] -- needs initial auth    Authorization - Visit Number 4    Authorization - Number of Visits 4    PT Start Time 0802    PT Stop Time 0835    PT Time Calculation (min) 33 min    Activity Tolerance Patient tolerated treatment well    Behavior During Therapy [facility] for tasks assessed/performed           Past Medical History:  Diagnosis Date  . Acid reflux   . Bartholin cyst     Past Surgical History:  Procedure Laterality Date  . CESAREAN SECTION    . CESAREAN SECTION  05/23/2011   Procedure: CESAREAN SECTION;  Surgeon: Agnes Lawrence, MD;  Location: [facility];  Service: Gynecology;  Laterality: N/A;  Repeat cesarean section with delivery of baby boy at 86. Apgars 9/9.  Marland Kitchen CYSTECTOMY      glands removed from bil axilla due to cyst formation  . sweat glands     removed from under arms    There were no vitals filed for this visit.   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 Pt reports her neck is bothering her some this morning. Frequency of pain has decreased but intensity is about the same. Pt states she is still feeling it around noon time or laying down for long period of time.    How long can you sit comfortably? n/a    How long can you stand comfortably? n/a    How long can you walk comfortably? n/a    Patient Stated Goals Improve pain    Currently in Pain? Yes    Pain Score 3     Pain  Location Neck    Pain Orientation Left                             [facility] Adult PT Treatment/Exercise - 10/18/20 0001      Neck Exercises: Theraband   Other Theraband Exercises Green tband with cervical retraction x10, side flexion x10 bilat, rotation x10 bilat      Manual Therapy   Manual Therapy Passive ROM    Joint Mobilization R & L cervical transverse process rotation grade III; cervical side glides grade II to III; PA mobs grade II to III    Passive ROM Gentle rotation and side flexion PROM bilat with PNF 3x5 sec hold    Manual Traction 3x10 sec                    PT Short Term Goals - 10/18/20 2671      PT SHORT TERM GOAL #1   Title Pt will be independent with initial HEP    Baseline Newly provided    Time 3    Period Weeks    Status Achieved    Target  Date 10/14/20      PT SHORT TERM GOAL #2   Title Pt will have improved L side neck ROM by at least 5 deg in all directions    Baseline 30 deg extension, 35 deg side flexion, 45 deg rotation    Time 3    Period Weeks    Status Achieved    Target Date 10/14/20      PT SHORT TERM GOAL #3   Title Pt will report decrease in pain frequency and duration by 50%    Baseline At worst 8/10 pain; decreased frequency/duration but intensity is the same per pt report 3/11    Time 3    Period Weeks    Status Partially Met    Target Date 10/14/20             PT Long Term Goals - 09/23/20 1154      PT LONG TERM GOAL #1   Title Pt will be independent with self managing symptoms    Time 6    Period Weeks    Status New    Target Date 10/14/20      PT LONG TERM GOAL #2   Title Pt will have equal L and R neck ROM bilat    Baseline Currently less movement with L neck movement    Time 6    Period Weeks    Status New    Target Date 11/04/20      PT LONG TERM GOAL #3   Title Pt will report </=2/10 pain at most with equal and full sensation between L & R UEs    Baseline 8/10 at worst; L C3-C6  dermatomes decreased vs R    Time 6    Period Weeks    Status New    Target Date 11/04/20      PT LONG TERM GOAL #4   Title Pt will report return to PLOF    Time 6    Period Weeks    Status New    Target Date 11/04/20                 Plan - 10/18/20 0840    Clinical Impression Statement Treatment session focused on continuing to improve pt's cervical ROM. After manual therapy pt able to perform full ROM without "pinching" sensation. R cervical spine appears more hypomobile today. Progressed pt's strengthening exercises to include theraband. Pt with decreasing frequency of spasms.    Personal Factors and Comorbidities Age    Examination-Activity Limitations Sleep;Hygiene/Grooming    Examination-Participation Restrictions Occupation;Driving;Community Activity;Cleaning    Stability/Clinical Decision Making Stable/Uncomplicated    Rehab Potential Good    PT Frequency 2x / week    PT Duration 6 weeks    PT Treatment/Interventions ADLs/Self Care Home Management;Biofeedback;Cryotherapy;Electrical Stimulation;Moist Heat;Iontophoresis 76m/ml Dexamethasone;DME Instruction;Ultrasound;Traction;Functional mobility training;Therapeutic activities;Therapeutic exercise;Balance training;Neuromuscular re-education;Manual techniques;Patient/family education;Passive range of motion;Dry needling;Taping;Spinal Manipulations;Joint Manipulations    PT Next Visit Plan Assess response to HEP and modify as needed. Manual therapy to address muscle tightness and improve facet joint articulation as needed. Progress to cervical motion with scapular/shoulder strengthening (consider quadruped)    PT Home Exercise Plan Access Code: [email or web address]   Consulted and Agree with Plan of Care Patient           Patient will benefit from skilled therapeutic intervention in order to improve the following deficits and impairments:  Decreased range of motion,Increased fascial restricitons,Increased muscle  spasms,Pain,Hypomobility,Improper body mechanics,Decreased mobility,Decreased strength,Impaired sensation,Postural dysfunction  Visit Diagnosis:  Cervicalgia  Radiculopathy, cervical region  Other disturbances of skin sensation  Abnormal posture  Muscle weakness (generalized)     Problem List Patient Active Problem List   Diagnosis Date Noted  . Previous cesarean section complicating pregnancy 98/47/3085  . Cesarean delivery, without mention of indication, delivered, with or without mention of antepartum condition 05/23/2011  . Vulval cellulitis 04/12/2011  . Normal pregnancy 04/12/2011    [facility] April Ma L Zarinah Oviatt PT, DPT 10/18/2020, 8:43 AM  [facility] [place], [place], [address] Phone: [phone]   Fax:  [phone]  Name: Nancy Swanson MRN: [id number] Date of Birth: September 28, 1985

## 2020-10-21 ENCOUNTER — Ambulatory Visit (HOSPITAL_BASED_OUTPATIENT_CLINIC_OR_DEPARTMENT_OTHER): Payer: Medicaid Other | Admitting: Physical Therapy

## 2020-10-21 ENCOUNTER — Other Ambulatory Visit: Payer: Self-pay

## 2020-10-21 DIAGNOSIS — R208 Other disturbances of skin sensation: Secondary | ICD-10-CM

## 2020-10-21 DIAGNOSIS — M542 Cervicalgia: Secondary | ICD-10-CM | POA: Diagnosis not present

## 2020-10-21 DIAGNOSIS — R293 Abnormal posture: Secondary | ICD-10-CM

## 2020-10-21 DIAGNOSIS — M5412 Radiculopathy, cervical region: Secondary | ICD-10-CM

## 2020-10-21 DIAGNOSIS — M6281 Muscle weakness (generalized): Secondary | ICD-10-CM

## 2020-10-21 NOTE — Therapy (Addendum)
[facility] [address] [place], [place], [address] Phone: [phone]   Fax:  [phone]  Physical Therapy Treatment  Patient Details  Name: Nancy Swanson MRN: [id number] Date of Birth: 15-May-1986 Referring Provider (PT): Berle Mull, MD   Encounter Date: 10/21/2020   PT End of Session - 10/21/20 0816    Visit Number 5    Number of Visits 13    Date for PT Re-Evaluation 11/04/20    Authorization Type [employer] -- needs initial auth    PT Start Time 0807    PT Stop Time 0840    PT Time Calculation (min) 33 min    Activity Tolerance Patient tolerated treatment well    Behavior During Therapy [facility] for tasks assessed/performed           Past Medical History:  Diagnosis Date  . Acid reflux   . Bartholin cyst     Past Surgical History:  Procedure Laterality Date  . CESAREAN SECTION    . CESAREAN SECTION  05/23/2011   Procedure: CESAREAN SECTION;  Surgeon: Agnes Lawrence, MD;  Location: [facility];  Service: Gynecology;  Laterality: N/A;  Repeat cesarean section with delivery of baby boy at 14. Apgars 9/9.  Marland Kitchen CYSTECTOMY      glands removed from bil axilla due to cyst formation  . sweat glands     removed from under arms    There were no vitals filed for this visit.   Subjective Assessment - 10/21/20 0813    Subjective Pt reports she has been working on her neck strengthening exercises. Pt reports pain yesterday -- pt states when she laid down she felt a flare up. She reports they don't last as long and not as painful.    How long can you sit comfortably? n/a    How long can you stand comfortably? n/a    How long can you walk comfortably? n/a    Patient Stated Goals Improve pain    Currently in Pain? No/denies                             [facility] Adult PT Treatment/Exercise - 10/21/20 0001      Neck Exercises: Machines for Strengthening   UBE (Upper Arm Bike) L1 x 3 min forward  and then backward      Neck Exercises: Theraband   Other Theraband Exercises Green tband with cervical retraction x10, side flexion x10 bilat, rotation x10 bilat      Neck Exercises: Standing   Other Standing Exercises Neck retraction against ball with low trap setting 2x10; row green tband 2x10; shoulder extensions green tband 2x10; palloff press green tband 2x10                    PT Short Term Goals - 10/18/20 9470      PT SHORT TERM GOAL #1   Title Pt will be independent with initial HEP    Baseline Newly provided    Time 3    Period Weeks    Status Achieved    Target Date 10/14/20      PT SHORT TERM GOAL #2   Title Pt will have improved L side neck ROM by at least 5 deg in all directions    Baseline 30 deg extension, 35 deg side flexion, 45 deg rotation    Time 3    Period Weeks    Status  Achieved    Target Date 10/14/20      PT SHORT TERM GOAL #3   Title Pt will report decrease in pain frequency and duration by 50%    Baseline At worst 8/10 pain; decreased frequency/duration but intensity is the same per pt report 3/11    Time 3    Period Weeks    Status Partially Met    Target Date 10/14/20             PT Long Term Goals - 09/23/20 1154      PT LONG TERM GOAL #1   Title Pt will be independent with self managing symptoms    Time 6    Period Weeks    Status New    Target Date 10/14/20      PT LONG TERM GOAL #2   Title Pt will have equal L and R neck ROM bilat    Baseline Currently less movement with L neck movement    Time 6    Period Weeks    Status New    Target Date 11/04/20      PT LONG TERM GOAL #3   Title Pt will report </=2/10 pain at most with equal and full sensation between L & R UEs    Baseline 8/10 at worst; L C3-C6 dermatomes decreased vs R    Time 6    Period Weeks    Status New    Target Date 11/04/20      PT LONG TERM GOAL #4   Title Pt will report return to PLOF    Time 6    Period Weeks    Status New    Target  Date 11/04/20                 Plan - 10/21/20 4765    Clinical Impression Statement Pt comes into PT with full ROM without pinching. Session focused on neck stabilizer endurance with addition of shoulder movement/scapular strengthening with theraband in standing. Pt requires cueing to keep from right cervical lateral flexion with addition of shoulder movement. Pt request for shortened session so she can get to work in time at 31.    Personal Factors and Comorbidities Age    Examination-Activity Limitations Sleep;Hygiene/Grooming    Examination-Participation Restrictions Occupation;Driving;Community Activity;Cleaning    Stability/Clinical Decision Making Stable/Uncomplicated    Rehab Potential Good    PT Frequency 2x / week    PT Duration 6 weeks    PT Treatment/Interventions ADLs/Self Care Home Management;Biofeedback;Cryotherapy;Electrical Stimulation;Moist Heat;Iontophoresis 78m/ml Dexamethasone;DME Instruction;Ultrasound;Traction;Functional mobility training;Therapeutic activities;Therapeutic exercise;Balance training;Neuromuscular re-education;Manual techniques;Patient/family education;Passive range of motion;Dry needling;Taping;Spinal Manipulations;Joint Manipulations    PT Next Visit Plan Assess response to HEP and modify as needed. Manual therapy to address muscle tightness and improve facet joint articulation as needed. Progress to cervical motion with scapular/shoulder strengthening (consider quadruped)    PT Home Exercise Plan Access Code: [email or web address]   Consulted and Agree with Plan of Care Patient           Patient will benefit from skilled therapeutic intervention in order to improve the following deficits and impairments:  Decreased range of motion,Increased fascial restricitons,Increased muscle spasms,Pain,Hypomobility,Improper body mechanics,Decreased mobility,Decreased strength,Impaired sensation,Postural dysfunction  Visit Diagnosis: Cervicalgia  Radiculopathy, cervical  region  Other disturbances of skin sensation  Abnormal posture  Muscle weakness (generalized)     Problem List Patient Active Problem List   Diagnosis Date Noted  . Previous cesarean section complicating pregnancy 156/81/2751 . Cesarean delivery, without  mention of indication, delivered, with or without mention of antepartum condition 05/23/2011  . Vulval cellulitis 04/12/2011  . Normal pregnancy 04/12/2011    [facility] April Ma L Vencil Basnett PT, DPT 10/21/2020, 8:42 AM  [facility] [place], [place], [address] Phone: [phone]   Fax:  [phone]  Name: Nancy Swanson MRN: [id number] Date of Birth: [birth year]

## 2020-10-22 ENCOUNTER — Encounter (HOSPITAL_BASED_OUTPATIENT_CLINIC_OR_DEPARTMENT_OTHER): Payer: Medicaid Other | Admitting: Physical Therapy

## 2020-10-24 ENCOUNTER — Encounter (HOSPITAL_BASED_OUTPATIENT_CLINIC_OR_DEPARTMENT_OTHER): Payer: Medicaid Other | Admitting: Physical Therapy

## 2020-10-25 ENCOUNTER — Ambulatory Visit (HOSPITAL_BASED_OUTPATIENT_CLINIC_OR_DEPARTMENT_OTHER): Payer: Medicaid Other | Admitting: Physical Therapy

## 2020-10-25 ENCOUNTER — Other Ambulatory Visit: Payer: Self-pay

## 2020-10-25 DIAGNOSIS — R293 Abnormal posture: Secondary | ICD-10-CM

## 2020-10-25 DIAGNOSIS — M542 Cervicalgia: Secondary | ICD-10-CM

## 2020-10-25 DIAGNOSIS — M5412 Radiculopathy, cervical region: Secondary | ICD-10-CM

## 2020-10-25 DIAGNOSIS — R208 Other disturbances of skin sensation: Secondary | ICD-10-CM

## 2020-10-25 DIAGNOSIS — M6281 Muscle weakness (generalized): Secondary | ICD-10-CM

## 2020-10-28 ENCOUNTER — Ambulatory Visit (HOSPITAL_BASED_OUTPATIENT_CLINIC_OR_DEPARTMENT_OTHER): Payer: Medicaid Other | Admitting: Physical Therapy

## 2020-10-28 ENCOUNTER — Encounter (HOSPITAL_BASED_OUTPATIENT_CLINIC_OR_DEPARTMENT_OTHER): Payer: Self-pay | Admitting: Physical Therapy

## 2020-10-28 NOTE — Therapy (Signed)
[facility] [address] [place], [place], [address] Phone: [phone]   Fax:  [phone]  Physical Therapy Treatment  Patient Details  Name: MERRIDITH DERSHEM MRN: [id number] Date of Birth: 06-25-1986 Referring Provider (PT): Berle Mull, MD   Encounter Date: 10/25/2020    Past Medical History:  Diagnosis Date  . Acid reflux   . Bartholin cyst     Past Surgical History:  Procedure Laterality Date  . CESAREAN SECTION    . CESAREAN SECTION  05/23/2011   Procedure: CESAREAN SECTION;  Surgeon: Agnes Lawrence, MD;  Location: [facility];  Service: Gynecology;  Laterality: N/A;  Repeat cesarean section with delivery of baby boy at 74. Apgars 9/9.  Marland Kitchen CYSTECTOMY      glands removed from bil axilla due to cyst formation  . sweat glands     removed from under arms    There were no vitals filed for this visit.   Subjective Assessment - 10/28/20 0754    Subjective Pt states she's been doing well. Pt reports no huge changes in her neck pain since last visit. Pt reports she's been walking a lot.    How long can you sit comfortably? n/a    How long can you stand comfortably? n/a    How long can you walk comfortably? n/a    Patient Stated Goals Improve pain    Currently in Pain? No/denies                             [facility] Adult PT Treatment/Exercise - 10/28/20 0001      Neck Exercises: Machines for Strengthening   UBE (Upper Arm Bike) L1 x 2 min forward and then backward      Neck Exercises: Standing   Other Standing Exercises Neck retraction against ball with ER green tband 2x10; low trap setting 2x10; row green tband 2x10; shoulder extensions green tband 2x10      Manual Therapy   Manual therapy comments STW L mid/upper thoracic and TPR; pt provided self massage with tennis ball    Joint Mobilization Thoracic PA mobs along spinous process and transverse processes for rotation grade II to III                     PT Short Term Goals - 10/18/20 0842      PT SHORT TERM GOAL #1   Title Pt will be independent with initial HEP    Baseline Newly provided    Time 3    Period Weeks    Status Achieved    Target Date 10/14/20      PT SHORT TERM GOAL #2   Title Pt will have improved L side neck ROM by at least 5 deg in all directions    Baseline 30 deg extension, 35 deg side flexion, 45 deg rotation    Time 3    Period Weeks    Status Achieved    Target Date 10/14/20      PT SHORT TERM GOAL #3   Title Pt will report decrease in pain frequency and duration by 50%    Baseline At worst 8/10 pain; decreased frequency/duration but intensity is the same per pt report 3/11    Time 3    Period Weeks    Status Partially Met    Target Date 10/14/20             PT  Long Term Goals - 09/23/20 1154      PT LONG TERM GOAL #1   Title Pt will be independent with self managing symptoms    Time 6    Period Weeks    Status New    Target Date 10/14/20      PT LONG TERM GOAL #2   Title Pt will have equal L and R neck ROM bilat    Baseline Currently less movement with L neck movement    Time 6    Period Weeks    Status New    Target Date 11/04/20      PT LONG TERM GOAL #3   Title Pt will report </=2/10 pain at most with equal and full sensation between L & R UEs    Baseline 8/10 at worst; L C3-C6 dermatomes decreased vs R    Time 6    Period Weeks    Status New    Target Date 11/04/20      PT LONG TERM GOAL #4   Title Pt will report return to PLOF    Time 6    Period Weeks    Status New    Target Date 11/04/20                 Plan - 10/28/20 0754    Clinical Impression Statement Continued to work on neck endurance and maintaining proper alignment. Pt's spasms now originating more towards L upper thoracic -- addressed with stretches and manual therapy. Pt educated to use tennis ball for self massage. Pt continues to require shortened session for her work.     Personal Factors and Comorbidities Age    Examination-Activity Limitations Sleep;Hygiene/Grooming    Examination-Participation Restrictions Occupation;Driving;Community Activity;Cleaning    Stability/Clinical Decision Making Stable/Uncomplicated    Rehab Potential Good    PT Frequency 2x / week    PT Duration 6 weeks    PT Treatment/Interventions ADLs/Self Care Home Management;Biofeedback;Cryotherapy;Electrical Stimulation;Moist Heat;Iontophoresis 87m/ml Dexamethasone;DME Instruction;Ultrasound;Traction;Functional mobility training;Therapeutic activities;Therapeutic exercise;Balance training;Neuromuscular re-education;Manual techniques;Patient/family education;Passive range of motion;Dry needling;Taping;Spinal Manipulations;Joint Manipulations    PT Next Visit Plan Assess response to HEP and modify as needed. Manual therapy to address muscle tightness and improve facet joint articulation as needed (i.e. thoracic spine). Continue cervical motion with scapular/shoulder strengthening (consider quadruped)    PT Home Exercise Plan Access Code: [email or web address]   Consulted and Agree with Plan of Care Patient           Patient will benefit from skilled therapeutic intervention in order to improve the following deficits and impairments:  Decreased range of motion,Increased fascial restricitons,Increased muscle spasms,Pain,Hypomobility,Improper body mechanics,Decreased mobility,Decreased strength,Impaired sensation,Postural dysfunction  Visit Diagnosis: No diagnosis found.     Problem List Patient Active Problem List   Diagnosis Date Noted  . Previous cesarean section complicating pregnancy 155/20/8022 . Cesarean delivery, without mention of indication, delivered, with or without mention of antepartum condition 05/23/2011  . Vulval cellulitis 04/12/2011  . Normal pregnancy 04/12/2011    [facility] Ma L Arienne Gartin PT, DPT 10/28/2020, 8:01 AM  [facility] [address]Phone: [phone]  Fax:  [phone] Name: TDELBRA ZELLARSMRN: [id number] of Birth: [birth year]

## 2020-10-29 ENCOUNTER — Encounter (HOSPITAL_BASED_OUTPATIENT_CLINIC_OR_DEPARTMENT_OTHER): Payer: Medicaid Other | Admitting: Physical Therapy

## 2020-10-31 ENCOUNTER — Encounter (HOSPITAL_BASED_OUTPATIENT_CLINIC_OR_DEPARTMENT_OTHER): Payer: Medicaid Other | Admitting: Physical Therapy

## 2020-11-01 ENCOUNTER — Ambulatory Visit (HOSPITAL_BASED_OUTPATIENT_CLINIC_OR_DEPARTMENT_OTHER): Payer: Medicaid Other | Admitting: Physical Therapy

## 2020-11-04 ENCOUNTER — Ambulatory Visit (HOSPITAL_BASED_OUTPATIENT_CLINIC_OR_DEPARTMENT_OTHER): Payer: Medicaid Other | Admitting: Physical Therapy

## 2020-11-04 ENCOUNTER — Telehealth (HOSPITAL_BASED_OUTPATIENT_CLINIC_OR_DEPARTMENT_OTHER): Payer: Self-pay | Admitting: Physical Therapy

## 2020-11-04 NOTE — Telephone Encounter (Signed)
Called pt in regards to her missed visit.   Discussed with pt that we have no more visits scheduled for her and if she was interested in d/c. Pt states she would like to schedule a few more visits before d/c. PT transferred call over to scheduling.   Ariadna Setter April Dell Ponto, PT, DPT

## 2020-11-05 ENCOUNTER — Encounter (HOSPITAL_BASED_OUTPATIENT_CLINIC_OR_DEPARTMENT_OTHER): Payer: Medicaid Other | Admitting: Physical Therapy

## 2020-11-07 ENCOUNTER — Encounter (HOSPITAL_BASED_OUTPATIENT_CLINIC_OR_DEPARTMENT_OTHER): Payer: Medicaid Other | Admitting: Physical Therapy

## 2020-11-08 ENCOUNTER — Encounter (HOSPITAL_BASED_OUTPATIENT_CLINIC_OR_DEPARTMENT_OTHER): Payer: Medicaid Other | Admitting: Physical Therapy

## 2020-11-12 ENCOUNTER — Encounter (HOSPITAL_BASED_OUTPATIENT_CLINIC_OR_DEPARTMENT_OTHER): Payer: Medicaid Other | Admitting: Physical Therapy

## 2020-11-14 ENCOUNTER — Encounter (HOSPITAL_BASED_OUTPATIENT_CLINIC_OR_DEPARTMENT_OTHER): Payer: Medicaid Other | Admitting: Physical Therapy

## 2020-11-16 ENCOUNTER — Other Ambulatory Visit: Payer: Self-pay

## 2020-11-16 ENCOUNTER — Ambulatory Visit: Payer: Medicaid Other | Attending: Sports Medicine

## 2020-11-16 DIAGNOSIS — M542 Cervicalgia: Secondary | ICD-10-CM | POA: Diagnosis present

## 2020-11-16 DIAGNOSIS — M6281 Muscle weakness (generalized): Secondary | ICD-10-CM | POA: Insufficient documentation

## 2020-11-16 DIAGNOSIS — M5412 Radiculopathy, cervical region: Secondary | ICD-10-CM | POA: Insufficient documentation

## 2020-11-16 DIAGNOSIS — R208 Other disturbances of skin sensation: Secondary | ICD-10-CM | POA: Insufficient documentation

## 2020-11-16 DIAGNOSIS — R293 Abnormal posture: Secondary | ICD-10-CM | POA: Diagnosis not present

## 2020-11-16 NOTE — Therapy (Signed)
[facility] [place], [place], [address] Phone: [phone]   Fax:  [phone]  Physical Therapy Treatment  Patient Details  Name: Nancy Swanson MRN: [id number] Date of Birth: 30-Dec-1985 Referring Provider (PT): Berle Mull, MD   Encounter Date: 11/16/2020   PT End of Session - 11/16/20 1140    Visit Number 7    Number of Visits 13    Date for PT Re-Evaluation 12/06/20    Authorization Type [employer] -- needs initial auth    Authorization - Visit Number 5    Authorization - Number of Visits 12    PT Start Time 3662    PT Stop Time 9476    PT Time Calculation (min) 40 min    Activity Tolerance Patient tolerated treatment well    Behavior During Therapy [facility] for tasks assessed/performed           Past Medical History:  Diagnosis Date  . Acid reflux   . Bartholin cyst     Past Surgical History:  Procedure Laterality Date  . CESAREAN SECTION    . CESAREAN SECTION  05/23/2011   Procedure: CESAREAN SECTION;  Surgeon: Agnes Lawrence, MD;  Location: [facility];  Service: Gynecology;  Laterality: N/A;  Repeat cesarean section with delivery of baby boy at 81. Apgars 9/9.  Marland Kitchen CYSTECTOMY      glands removed from bil axilla due to cyst formation  . sweat glands     removed from under arms    There were no vitals filed for this visit.   Subjective Assessment - 11/16/20 1125    Subjective She reports no pain to start.She feels sh is doing better.    Currently in Pain? No/denies              [facility] PT Assessment - 11/16/20 0001      AROM   Cervical - Right Rotation 65    Cervical - Left Rotation 65                         [facility] Adult PT Treatment/Exercise - 11/16/20 0001      Neck Exercises: Machines for Strengthening   UBE (Upper Arm Bike) L2 2 min forward and 2 min back      Neck Exercises: Standing   Other Standing Exercises Neck retraction against ball with ER green  tband 2x10; low trap setting 2x10; row green tband 2x10; shoulder extensions green tband 2x10      Neck Exercises: Supine   Capital Flexion Limitations with band exercises    Other Supine Exercise green band ER and hor abduction 2x10           Side  Lye arm openings RT and LT 5 reps.  Reviewed STW with one and 2 balls.  Issued 2 tennis balls       PT Education - 11/16/20 1209    Education Details HEP    Person(s) Educated Patient    Methods Explanation;Demonstration;Tactile cues;Verbal cues    Comprehension Verbalized understanding;Returned demonstration            PT Short Term Goals - 11/16/20 1130      PT SHORT TERM GOAL #1   Title Pt will be independent with initial HEP    Status Achieved      PT SHORT TERM GOAL #3   Title Pt will report decrease in pain frequency and duration by 50%  Baseline not specific but overall improved    Status Partially Met             PT Long Term Goals - 11/16/20 1138      PT LONG TERM GOAL #1   Title Pt will be independent with self managing symptoms    Status On-going      PT LONG TERM GOAL #2   Title Pt will have equal L and R neck ROM bilat    Baseline rotation 65 degrees RT/Lt    Status Partially Met      PT LONG TERM GOAL #3   Title Pt will report </=2/10 pain at most with equal and full sensation between L & R UEs    Baseline Can still exceed 2/10 but more time with no pain    Status On-going                 Plan - 11/16/20 1211    Clinical Impression Statement She is doing better with decreased pain  andand discharge when goals met of plateaus.  improved ROm Will continue plan per primary PT    PT Frequency --   4 visits   PT Duration --   over 4 weeks   PT Treatment/Interventions ADLs/Self Care Home Management;Biofeedback;Cryotherapy;Electrical Stimulation;Moist Heat;Iontophoresis 19m/ml Dexamethasone;DME Instruction;Ultrasound;Traction;Functional mobility training;Therapeutic activities;Therapeutic  exercise;Balance training;Neuromuscular re-education;Manual techniques;Patient/family education;Passive range of motion;Dry needling;Taping;Spinal Manipulations;Joint Manipulations    PT Next Visit Plan Assess response to HEP and modify as needed. Manual therapy to address muscle tightness and improve facet joint articulation as needed (i.e. thoracic spine). Continue cervical motion with scapular/shoulder strengthening (consider quadruped)    PT Home Exercise Plan Access Code: [email or web address]             Stand row and extension with bands and chin tuck, supine ER and hor abduciton  arm openings    Consulted and Agree with Plan of Care Patient           Patient will benefit from skilled therapeutic intervention in order to improve the following deficits and impairments:  Decreased range of motion,Increased fascial restricitons,Increased muscle spasms,Pain,Hypomobility,Improper body mechanics,Decreased mobility,Decreased strength,Impaired sensation,Postural dysfunction  Visit Diagnosis: Cervicalgia  Radiculopathy, cervical region  Other disturbances of skin sensation     Problem List Patient Active Problem List   Diagnosis Date Noted  . Previous cesarean section complicating pregnancy 140/81/4481 . Cesarean delivery, without mention of indication, delivered, with or without mention of antepartum condition 05/23/2011  . Vulval cellulitis 04/12/2011  . Normal pregnancy 04/12/2011    CDarrel Hoover PT 11/16/2020, 12:17 PM  [facility] [address]Phone: [phone]  Fax:  [phone] Name: Nancy PERRENMRN: [id number] of Birth: 9June 05, 1987

## 2020-11-16 NOTE — Patient Instructions (Addendum)
Standing band extension and row x 10-20 reps every 2-3 days Supine with chin tucks x 10-20 repsand band ER and hor abduciton every 2-3 days Sid lying  Arm openings x 5-10 RT/Lt every 2-3 days. Marland Kitchen

## 2020-11-27 ENCOUNTER — Ambulatory Visit: Payer: Medicaid Other

## 2020-11-27 ENCOUNTER — Other Ambulatory Visit: Payer: Self-pay

## 2020-11-27 DIAGNOSIS — R293 Abnormal posture: Secondary | ICD-10-CM

## 2020-11-27 DIAGNOSIS — M5412 Radiculopathy, cervical region: Secondary | ICD-10-CM | POA: Diagnosis not present

## 2020-11-27 DIAGNOSIS — M6281 Muscle weakness (generalized): Secondary | ICD-10-CM

## 2020-11-27 DIAGNOSIS — R208 Other disturbances of skin sensation: Secondary | ICD-10-CM

## 2020-11-27 DIAGNOSIS — M542 Cervicalgia: Secondary | ICD-10-CM

## 2020-11-28 NOTE — Therapy (Addendum)
[place], [place], [address] Phone: [phone]   Fax:  [phone]  Physical Therapy Treatment and Discharge  Patient Details  Name: Nancy Swanson MRN: [id number] Date of Birth: [birth year] Referring Provider (PT): Berle Mull, MD  PHYSICAL THERAPY DISCHARGE SUMMARY  Visits from Start of Care: 8  Current functional level related to goals / functional outcomes: See below   Remaining deficits: See below   Education / Equipment: See below  Plan: Patient agrees to discharge.  Patient goals were partially met. Patient is being discharged due to not returning since last visit.        Encounter Date: 11/27/2020   PT End of Session - 11/27/20 1844     Visit Number 8    Number of Visits 13    Date for PT Re-Evaluation 12/06/20    Authorization Type [employer] -- needs initial auth    Authorization - Visit Number 6    Authorization - Number of Visits 12    PT Start Time 1840    PT Stop Time 1920    PT Time Calculation (min) 40 min    Activity Tolerance Patient tolerated treatment well    Behavior During Therapy WFL for tasks assessed/performed             Past Medical History:  Diagnosis Date   Acid reflux    Bartholin cyst     Past Surgical History:  Procedure Laterality Date   CESAREAN SECTION     CESAREAN SECTION  05/23/2011   Procedure: CESAREAN SECTION;  Surgeon: Agnes Lawrence, MD;  Location: [facility];  Service: Gynecology;  Laterality: N/A;  Repeat cesarean section with delivery of baby boy at 79. Apgars 9/9.   CYSTECTOMY      glands removed from bil axilla due to cyst formation   sweat glands     removed from under arms    There were no vitals filed for this visit.   Subjective Assessment - 11/27/20 1845     Subjective Pt reports she experienced neck pain this weekend, a 4/10. It lasted approx 1 hour and resolved. Pt completed her HEP which seemed to help.     Currently in Pain? No/denies    Pain Score 0-No pain    Pain Location Neck                               [facility] Adult PT Treatment/Exercise - 11/28/20 0001       Neck Exercises: Machines for Strengthening   UBE (Upper Arm Bike) L2 2 min forward and 2 min back      Neck Exercises: Standing   Other Standing Exercises Neck retraction against ball with ER green tband 2x10; low trap setting 2x10; row green tband 2x10; shoulder extensions green tband 2x10      Neck Exercises: Seated   Neck Retraction 5 reps;3 secs    Cervical Rotation Right;Left;5 reps   10 sec   Cervical Rotation Limitations with towel assist      Neck Exercises: Supine   Other Supine Exercise green band ER and hor abduction 2x10      Neck Exercises: Stretches   Upper Trapezius Stretch Right;Left;2 reps;20 seconds    Levator Stretch Right;Left;2 reps;20 seconds                    PT Education - 11/28/20 7903  Education Details Reviewed the benefits of good posture and negative impact of poor posture and how her HEP helps to correct poor posture. Sleeping positions and support for comfort.    Person(s) Educated Patient    Methods Explanation;Demonstration;Tactile cues;Verbal cues    Comprehension Verbalized understanding;Returned demonstration              PT Short Term Goals - 11/16/20 1130       PT SHORT TERM GOAL #1   Title Pt will be independent with initial HEP    Status Achieved      PT SHORT TERM GOAL #3   Title Pt will report decrease in pain frequency and duration by 50%    Baseline not specific but overall improved    Status Partially Met               PT Long Term Goals - 11/16/20 1138       PT LONG TERM GOAL #1   Title Pt will be independent with self managing symptoms    Status On-going      PT LONG TERM GOAL #2   Title Pt will have equal L and R neck ROM bilat    Baseline rotation 65 degrees RT/Lt    Status Partially Met      PT LONG  TERM GOAL #3   Title Pt will report </=2/10 pain at most with equal and full sensation between L & R UEs    Baseline Can still exceed 2/10 but more time with no pain    Status On-going                   Plan - 11/28/20 2130     Clinical Impression Statement Pt is continuing to be doing well re: neck pain. She experienced L neck pain this weekend for about an hour and it resolved. Reviewed Education re: posture and sleeping positions and support for comfort. Ther ex was continued for cervical stretches and posterior chain strengthening. Pt tolerated today's PT session without adverse effects.    Personal Factors and Comorbidities Age    Examination-Activity Limitations Sleep;Hygiene/Grooming    Examination-Participation Restrictions Occupation;Driving;Community Activity;Cleaning    Stability/Clinical Decision Making Stable/Uncomplicated    Clinical Decision Making Low    Rehab Potential Good    PT Frequency --   4 visits   PT Duration 4 weeks    PT Treatment/Interventions ADLs/Self Care Home Management;Biofeedback;Cryotherapy;Electrical Stimulation;Moist Heat;Iontophoresis 12m/ml Dexamethasone;DME Instruction;Ultrasound;Traction;Functional mobility training;Therapeutic activities;Therapeutic exercise;Balance training;Neuromuscular re-education;Manual techniques;Patient/family education;Passive range of motion;Dry needling;Taping;Spinal Manipulations;Joint Manipulations    PT Next Visit Plan Assess response to HEP and modify as needed. Manual therapy to address muscle tightness and improve facet joint articulation as needed (i.e. thoracic spine). Continue cervical motion with scapular/shoulder strengthening (consider quadruped)    PT Home Exercise Plan Access Code: [email or web address]             Stand row and extension with bands and chin tuck, supine ER and hor abduciton  arm openings    Consulted and Agree with Plan of Care Patient             Patient will benefit from skilled  therapeutic intervention in order to improve the following deficits and impairments:  Decreased range of motion,Increased fascial restricitons,Increased muscle spasms,Pain,Hypomobility,Improper body mechanics,Decreased mobility,Decreased strength,Impaired sensation,Postural dysfunction  Visit Diagnosis: Cervicalgia  Radiculopathy, cervical region  Other disturbances of skin sensation  Abnormal posture  Muscle weakness (generalized)     Problem List Patient Active  Problem List   Diagnosis Date Noted   Previous cesarean section complicating pregnancy 35/46/5681   Cesarean delivery, without mention of indication, delivered, with or without mention of antepartum condition 05/23/2011   Vulval cellulitis 04/12/2011   Normal pregnancy 04/12/2011    Gar Ponto MS, PT 11/28/20 6:57 AM  Estill Bamberg April Gordy Levan, PT, DPT 01/16/21 10:36 AM  [facility] [address] [place], [place], [address] Phone: [phone]   Fax:  [phone]  Name: HEAVENLEIGH PETRUZZI MRN: [id number] Date of Birth: 12/04/1985

## 2020-11-30 ENCOUNTER — Ambulatory Visit: Payer: Medicaid Other

## 2020-12-03 ENCOUNTER — Ambulatory Visit: Payer: Medicaid Other

## 2022-07-07 ENCOUNTER — Ambulatory Visit: Payer: Medicaid Other | Admitting: Physical Therapy

## 2022-07-07 NOTE — Therapy (Incomplete)
OUTPATIENT PHYSICAL THERAPY CERVICAL EVALUATION   Patient Name: Nancy Swanson MRN: [id number] DOB:03/17/1986, 36 y.o., female Today's Date: 07/07/2022  END OF SESSION:   Past Medical History:  Diagnosis Date   Acid reflux    Bartholin cyst    Past Surgical History:  Procedure Laterality Date   CESAREAN SECTION     CESAREAN SECTION  05/23/2011   Procedure: CESAREAN SECTION;  Surgeon: Roseanna Rainbow, MD;  Location: [facility];  Service: Gynecology;  Laterality: N/A;  Repeat cesarean section with delivery of baby boy at 73. Apgars 9/9.   CYSTECTOMY      glands removed from bil axilla due to cyst formation   sweat glands     removed from under arms   Patient Active Problem List   Diagnosis Date Noted   Previous cesarean section complicating pregnancy 05/23/2011   Cesarean delivery, without mention of indication, delivered, with or without mention of antepartum condition 05/23/2011   Vulval cellulitis 04/12/2011   Normal pregnancy 04/12/2011    PCP: [facility]  REFERRING PROVIDER: Vernetta Honey, PA-C  REFERRING DIAG: M54.2 (ICD-10-CM) - Cervicalgia  THERAPY DIAG:  No diagnosis found.  Rationale for Evaluation and Treatment: Rehabilitation  ONSET DATE: ***  SUBJECTIVE:                                                                                                                                                                                                         SUBJECTIVE STATEMENT: ***  PERTINENT HISTORY:  ***  PAIN:  Are you having pain? {[facility]PAIN:27236}  PRECAUTIONS: {Therapy precautions:24002}  WEIGHT BEARING RESTRICTIONS: {Yes ***/No:24003}  FALLS:  Has patient fallen in last 6 months? {fallsyesno:27318}  LIVING ENVIRONMENT: Lives with: {[facility] lives with:25569::"lives with their family"} Lives in: {Lives in:[address]} Stairs: {opstairs:27293} Has following equipment at home: {Assistive devices:23999}  OCCUPATION: ***  PLOF:  {PLOF:24004}  PATIENT GOALS: ***  NEXT MD VISIT:   OBJECTIVE:   DIAGNOSTIC FINDINGS:  ***  PATIENT SURVEYS:  NDI:   COGNITION: Overall cognitive status: Within functional limits for tasks assessed  SENSATION: Light touch intact all extremities  POSTURE: {posture:25561}  PALPATION: ***   CERVICAL ROM:   {AROM/PROM:27142} ROM A/PROM (deg) eval  Flexion   Extension   Right lateral flexion   Left lateral flexion   Right rotation   Left rotation    (Blank rows = not tested)  UPPER EXTREMITY ROM:  {AROM/PROM:27142} ROM Right eval Left eval  Shoulder flexion    Shoulder abduction    Shoulder internal rotation    Shoulder  external rotation    Elbow flexion    Elbow extension    Wrist flexion    Wrist extension     (Blank rows = not tested) Comments:   UPPER EXTREMITY MMT:  MMT Right eval Left eval  Shoulder flexion    Shoulder extension    Shoulder abduction    Shoulder extension    Shoulder internal rotation    Shoulder external rotation    Elbow flexion    Elbow extension    Grip strength     (Blank rows = not tested)    CERVICAL SPECIAL TESTS:  {Cervical special tests:25246}  FUNCTIONAL TESTS:  {Functional tests:24029}  TODAY'S TREATMENT:                                                                                                                              [facility] Adult PT Treatment:                                                DATE: 07/07/22 Therapeutic Exercise: *** Manual Therapy: *** Neuromuscular re-ed: *** Therapeutic Activity: *** Modalities: *** Self Care: ***  PATIENT EDUCATION:  Education details: Pt education on PT impairments, prognosis, and POC. Informed consent. Rationale for interventions, safe/appropriate HEP performance Person educated: Patient Education method: Explanation, Demonstration, Tactile cues, Verbal cues, and Handouts Education comprehension: verbalized understanding, returned demonstration,  verbal cues required, tactile cues required, and needs further education    HOME EXERCISE PROGRAM: ***  ASSESSMENT:  CLINICAL IMPRESSION: Patient is a 36 y.o. woman who was seen today for physical therapy evaluation and treatment for neck pain.   OBJECTIVE IMPAIRMENTS: {opptimpairments:25111}.   ACTIVITY LIMITATIONS: {activitylimitations:27494}  PARTICIPATION LIMITATIONS: {participationrestrictions:25113}  PERSONAL FACTORS: {Personal factors:25162} are also affecting patient's functional outcome.   REHAB POTENTIAL: {rehabpotential:25112}  CLINICAL DECISION MAKING: {clinical decision making:25114}  EVALUATION COMPLEXITY: {Evaluation complexity:25115}   GOALS: Goals reviewed with patient? {yes/no:20286}  SHORT TERM GOALS: Target date: ***  Pt will demonstrate appropriate understanding and performance of initially prescribed HEP in order to facilitate improved independence with management of symptoms.  Baseline: HEP provided on eval Goal status: INITIAL   2. Pt will score greater than or equal to *** on FOTO in order to demonstrate improved perception of function due to symptoms.  Baseline: ***  Goal status: INITIAL   LONG TERM GOALS: Target date: {follow up:25551}  Pt will score *** on FOTO in order to demonstrate improved perception of function due to symptoms. Baseline: *** Goal status: INITIAL  Pt will demonstrate at least *** degrees of active cervical ROM in order to demonstrate improved environmental awareness and safety with driving.  Baseline: see ROM chart above Goal status: INITIAL  Pt will demonstrate at least 4+/5 shoulder MMT for improved symmetry of UE strength and improved tolerance to functional movements.  Baseline: see MMT chart above Goal status: INITIAL   Pt will report/demonstrate ability to *** with less than 2 point increase on NPS in order to demonstrate improved tolerance to functional activities such as ***. Baseline: *** Goal status:  INITIAL    PLAN:  PT FREQUENCY: {rehab frequency:25116}  PT DURATION: {rehab duration:25117}  PLANNED INTERVENTIONS: {rehab planned interventions:25118::"Therapeutic exercises","Therapeutic activity","Neuromuscular re-education","Balance training","Gait training","Patient/Family education","Self Care","Joint mobilization"}  PLAN FOR NEXT SESSION: Progress ROM/strengthening exercises as able/appropriate, review HEP.    Ashley Murrain PT, DPT 07/07/2022 8:39 AM

## 2022-07-21 ENCOUNTER — Other Ambulatory Visit: Payer: Self-pay

## 2022-07-21 ENCOUNTER — Ambulatory Visit: Payer: Medicaid Other | Attending: Physician Assistant

## 2022-07-21 DIAGNOSIS — R252 Cramp and spasm: Secondary | ICD-10-CM | POA: Diagnosis present

## 2022-07-21 DIAGNOSIS — R208 Other disturbances of skin sensation: Secondary | ICD-10-CM | POA: Diagnosis present

## 2022-07-21 DIAGNOSIS — M542 Cervicalgia: Secondary | ICD-10-CM | POA: Insufficient documentation

## 2022-07-21 DIAGNOSIS — R293 Abnormal posture: Secondary | ICD-10-CM | POA: Insufficient documentation
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
## 2022-07-23 ENCOUNTER — Encounter: Payer: Self-pay | Admitting: Podiatry

## 2022-07-23 ENCOUNTER — Ambulatory Visit (INDEPENDENT_AMBULATORY_CARE_PROVIDER_SITE_OTHER): Payer: Medicaid Other | Admitting: Podiatry

## 2022-07-23 ENCOUNTER — Ambulatory Visit (INDEPENDENT_AMBULATORY_CARE_PROVIDER_SITE_OTHER): Payer: Medicaid Other

## 2022-07-23 DIAGNOSIS — M722 Plantar fascial fibromatosis: Secondary | ICD-10-CM

## 2022-07-23 MED ORDER — DICLOFENAC SODIUM 75 MG PO TBEC: 75.0000 mg | DELAYED_RELEASE_TABLET | Freq: Two times a day (BID) | ORAL | 2 refills | Status: AC

## 2022-07-23 MED ORDER — TRIAMCINOLONE ACETONIDE 10 MG/ML IJ SUSP
20.0000 mg | Freq: Once | INTRAMUSCULAR | Status: AC
  Administered 2022-07-23: 20 mg

## 2022-07-23 NOTE — Patient Instructions (Signed)

## 2022-07-24 NOTE — Progress Notes (Signed)
Subjective:   Patient ID: Nancy Swanson, female   DOB: 36 y.o.   MRN: [id number]   HPI Patient presents stating she has a lot of pain in her heel of both the and it has been going on for around 2 months.  States the right is worse than the left but she does not remember injury it is worse when she gets up after periods of sitting and patient smokes quarter pack a day and tries to be active   Review of Systems  All other systems reviewed and are negative.       Objective:  Physical Exam Vitals and nursing note reviewed.  Constitutional:      Appearance: She is well-developed.  Pulmonary:     Effort: Pulmonary effort is normal.  Musculoskeletal:        General: Normal range of motion.  Skin:    General: Skin is warm.  Neurological:     Mental Status: She is alert.     Neurovascular status intact muscle strength found to be adequate range of motion within normal limits.  Patient is found to have exquisite discomfort plantar aspect heel region right over left inflammation fluid around the medial band     Assessment:  Acute plantar fasciitis bilateral with inflammation fluid of the medial band     Plan:  H&P reviewed condition discussed treatment options and at this point sterile prep and injected the medial band bilateral at insertion 3 mg Kenalog 5 mg Xylocaine applied sterile dressings and instructed on reduced activity shoe gear modifications physical therapy.  Patient will be seen back as needed  X-rays indicate spur formation no indication stress fracture arthritis

## 2022-07-28 ENCOUNTER — Ambulatory Visit: Payer: Medicaid Other

## 2022-07-28 DIAGNOSIS — R293 Abnormal posture: Secondary | ICD-10-CM

## 2022-07-28 DIAGNOSIS — R208 Other disturbances of skin sensation: Secondary | ICD-10-CM

## 2022-07-28 DIAGNOSIS — M542 Cervicalgia: Secondary | ICD-10-CM

## 2022-07-28 DIAGNOSIS — R252 Cramp and spasm: Secondary | ICD-10-CM

## 2022-07-28 NOTE — Therapy (Signed)
OUTPATIENT PHYSICAL THERAPY TREATMENT NOTE   Patient Name: Nancy Swanson MRN: [id number] DOB:Jun 17, 1986, 36 y.o., female Today's Date: 07/28/2022  PCP: Trey Sailors, PA   REFERRING PROVIDER: Ethelda Chick, PA-C   END OF SESSION:   PT End of Session - 07/28/22 1336     Visit Number 2    Number of Visits 13    Authorization Type [place] MEDICAID [place]    PT Start Time 1330    PT Stop Time 1415    PT Time Calculation (min) 45 min    Behavior During Therapy WFL for tasks assessed/performed             Past Medical History:  Diagnosis Date   Acid reflux    Bartholin cyst    Past Surgical History:  Procedure Laterality Date   CESAREAN SECTION     CESAREAN SECTION  05/23/2011   Procedure: CESAREAN SECTION;  Surgeon: Agnes Lawrence, MD;  Location: [facility];  Service: Gynecology;  Laterality: N/A;  Repeat cesarean section with delivery of baby boy at 35. Apgars 9/9.   CYSTECTOMY      glands removed from bil axilla due to cyst formation   sweat glands     removed from under arms   Patient Active Problem List   Diagnosis Date Noted   Previous cesarean section complicating pregnancy [id number]   Cesarean delivery, without mention of indication, delivered, with or without mention of antepartum condition 05/23/2011   Vulval cellulitis 04/12/2011   Normal pregnancy 04/12/2011    REFERRING DIAG: M54.2 (ICD-10-CM) - Cervicalgia    THERAPY DIAG:  Cervicalgia  Abnormal posture  Cramp and spasm  Other disturbances of skin sensation  Rationale for Evaluation and Treatment Rehabilitation  SUBJECTIVE:                                                                                                                                                                                                          SUBJECTIVE STATEMENT: Pt reports her upper shoulder do not feel normal and she is experiencing tight-discomfort.   PAIN:  Are you having pain?  Yes: NPRS scale: 1/10 Pain location: Posterior neck Pain description: Tightness Aggravating factors: Sitting with poor posture, cleaning Relieving factors: Stretching On eval pain range: 3-8/10  PERTINENT HISTORY:  Tobacco use, high BMI   PRECAUTIONS: None   PATIENT GOALS: Decreased pain and better movement of her neck   NEXT MD VISIT:    OBJECTIVE: (objective measures completed at initial evaluation unless otherwise dated)  DIAGNOSTIC FINDINGS:  Not available in Epic   PATIENT SURVEYS:  NDI 30%   COGNITION: Overall cognitive status: Within functional limits for tasks assessed   SENSATION: WFL   POSTURE: forward head and increased lumbar lordosis   PALPATION: TTP midline CT junction and paraspinals of the area R>L          CERVICAL ROM:    Active ROM A/PROM (deg) eval  Flexion 52, posterior tight discomfort  Extension 50, posterior pressure pain  Right lateral flexion 42, R pressure pain  Left lateral flexion 43, R pulling pain  Right rotation 59, R pulling pain  Left rotation 72, R pulling pain   (Blank rows = not tested)   UPPER EXTREMITY ROM: Grossly WNLs and equal R and L Active ROM Right eval Left eval  Shoulder flexion      Shoulder extension      Shoulder abduction      Shoulder adduction      Shoulder extension      Shoulder internal rotation      Shoulder external rotation      Elbow flexion      Elbow extension      Wrist flexion      Wrist extension      Wrist ulnar deviation      Wrist radial deviation      Wrist pronation      Wrist supination       (Blank rows = not tested)   UPPER EXTREMITY MMT: Grossly 4+ to 5/5 and equal R and L MMT Right eval Left eval  Shoulder flexion      Shoulder extension      Shoulder abduction      Shoulder adduction      Shoulder extension      Shoulder internal rotation      Shoulder external rotation      Middle trapezius      Lower trapezius      Elbow flexion      Elbow extension       Wrist flexion      Wrist extension      Wrist ulnar deviation      Wrist radial deviation      Wrist pronation      Wrist supination      Grip strength       (Blank rows = not tested)   CERVICAL SPECIAL TESTS:  Spurling's test: Negative No radicular provocation, but a sensation of pressure pain to the R   FUNCTIONAL TESTS:  NT   TODAY'S TREATMENT:  [facility] Adult PT Treatment:                                                DATE: 07/28/22 Therapeutic Exercise: Supine chin tuck x10 5 sec DNF x5 5 sec Seated chin tuck x5 5" Seated bilat shoulder ER x15 RTB Seated shoulder hor abd  star pattern x5 RTB Standing open book x5 5" each Seated upper trap stretch x2 15" Seated cervical rotation x2 15" Updated HEP Manual Therapy: STM c MTPR to the bilat upper trap and levator Self care: For proper sitting posture and computer work station set up  [facility] Adult PT Treatment:                                                DATE: 07/21/22 Therapeutic Exercise: Developed, instructed in, and pt completed therex as noted in HEP    PATIENT EDUCATION:  Education details: Eval findings, POC, HEP Person educated: Patient Education method: Explanation, Demonstration, Tactile cues, Verbal cues, and Handouts Education comprehension: verbalized understanding, returned demonstration, verbal cues required, and tactile cues required   HOME EXERCISE PROGRAM: Access Code: [email or web address] URL: [email or web address] Date: 07/28/2022 Prepared by: Gar Ponto  Exercises - Seated Passive Cervical Retraction  - 4-6 x daily - 7 x weekly - 1 sets - 2-3 reps - 5 hold - Standing Cervical Rotation AROM with Overpressure  - 4-6 x daily - 7 x weekly - 1 sets - 2-3 reps - 10 hold - Seated Cervical Sidebending Stretch  - 4-6 x daily - 7 x weekly - 1 sets - 2-3 reps - 10 hold - Supine Cervical Retraction with Towel   - 1-2 x daily - 7 x weekly - 1 sets - 10 reps - 3 hold - Supine Deep Neck Flexor Training - Repetitions  - 1-2 x daily - 7 x weekly - 3 sets - 10 reps - 5 hold - Shoulder External Rotation and Scapular Retraction with Resistance  - 1 x daily - 7 x weekly - 2 sets - 10 reps - 3 hold - Standing Shoulder Horizontal Abduction with Resistance  - 1 x daily - 7 x weekly - 2 sets - 10 reps - 3 hold   ASSESSMENT:   CLINICAL IMPRESSION: PT was completed for STM c MTPR to the bilat upper traps and levator. Increased muscle tight was palpated L > R. Therex was then completed for cervical ROM and cervcial/upper back posterior chain/postural strengthening. Pt tolerated PT today without adverse effects. Pt will continue to benefit from skilled PT to address impairments for improved function. Will assess pt's response to initial PT interventio and her ability to complete her HEP.   OBJECTIVE IMPAIRMENTS: decreased activity tolerance, decreased ROM, decreased strength, increased muscle spasms, impaired UE functional use, postural dysfunction, obesity, and pain.    ACTIVITY LIMITATIONS: carrying, lifting, sitting, and caring for others   PARTICIPATION LIMITATIONS: meal prep, cleaning, laundry, community activity, occupation, and yard work   PERSONAL FACTORS: Fitness, Past/current experiences, Time since onset of injury/illness/exacerbation, and 1 comorbidity: tobacco use, high BMI  are also affecting patient's functional outcome.    GOALS:   SHORT TERM GOALS: Target date: 08/07/22   Pt will be Ind in an initial HEP Baseline: initiated Goal status: Ongoing   2.  Pt will voice understanding of measures to assist in pain reduction  Baseline: initiated Goal status: Ongoing   LONG TERM GOALS: Target date: 09/08/21   Pt will be Ind in a final HEP to maintain achieved LOF Baseline: initiated Goal status: INITIAL   2.  Increase cervical extension to 55d and R rotation to 70d for improved neck function an as  indiation of decreased pain Baseline: 50 and 59d respectively Goal status: INITIAL   3.  Pt will report a decrease in neck pain to 3/10 or less and a 50% reduction in hte N/T of her R medial arm Baseline: 3-8/10 Goal status: INITIAL   4.  Pt's NDI score will improved to  the MCID value of 20% as indication of improved function  Baseline: 30% Goal status: INITIAL     PLAN:   PT FREQUENCY: 2x/week   PT DURATION: 6 weeks   PLANNED INTERVENTIONS: Therapeutic exercises, Therapeutic activity, Self Care, Aquatic Therapy, Dry Needling, Electrical stimulation, Spinal manipulation, Spinal mobilization, Cryotherapy, Moist heat, Taping, Traction, Ionotophoresis 4mg /ml Dexamethasone, Manual therapy, and Re-evaluation   PLAN FOR NEXT SESSION: Review FOTO; assess response to HEP; progress therex as indicated; use of modalities, manual therapy; and TPDN as indicated.   Vincentina Sollers MS, PT 07/28/22 2:38 PM

## 2022-07-29 NOTE — Therapy (Incomplete)
OUTPATIENT PHYSICAL THERAPY TREATMENT NOTE   Patient Name: Nancy Swanson MRN: [id number] DOB:[birth year], 36 y.o., female Today's Date: 07/29/2022  PCP: Norm Salt, PA   REFERRING PROVIDER: Vernetta Honey, PA-C   END OF SESSION:     Past Medical History:  Diagnosis Date   Acid reflux    Bartholin cyst    Past Surgical History:  Procedure Laterality Date   CESAREAN SECTION     CESAREAN SECTION  05/23/2011   Procedure: CESAREAN SECTION;  Surgeon: Roseanna Rainbow, MD;  Location: [facility];  Service: Gynecology;  Laterality: N/A;  Repeat cesarean section with delivery of baby boy at 63. Apgars 9/9.   CYSTECTOMY      glands removed from bil axilla due to cyst formation   sweat glands     removed from under arms   Patient Active Problem List   Diagnosis Date Noted   Previous cesarean section complicating pregnancy 05/23/2011   Cesarean delivery, without mention of indication, delivered, with or without mention of antepartum condition 05/23/2011   Vulval cellulitis 04/12/2011   Normal pregnancy 04/12/2011    REFERRING DIAG: M54.2 (ICD-10-CM) - Cervicalgia    THERAPY DIAG:  No diagnosis found.  Rationale for Evaluation and Treatment Rehabilitation  SUBJECTIVE:                                                                                                                                                                                                          SUBJECTIVE STATEMENT: Pt reports her upper shoulder do not feel normal and she is experiencing tight-discomfort.   PAIN:  Are you having pain? Yes: NPRS scale: 1/10 Pain location: Posterior neck Pain description: Tightness Aggravating factors: Sitting with poor posture, cleaning Relieving factors: Stretching On eval pain range: 3-8/10  PERTINENT HISTORY:  Tobacco use, high BMI   PRECAUTIONS: None   PATIENT GOALS: Decreased pain and better movement of her neck   NEXT MD VISIT:    OBJECTIVE:  (objective measures completed at initial evaluation unless otherwise dated)    DIAGNOSTIC FINDINGS:  Not available in Epic   PATIENT SURVEYS:  NDI 30%   COGNITION: Overall cognitive status: Within functional limits for tasks assessed   SENSATION: WFL   POSTURE: forward head and increased lumbar lordosis   PALPATION: TTP midline CT junction and paraspinals of the area R>L          CERVICAL ROM:    Active ROM A/PROM (deg) eval  Flexion 52, posterior tight discomfort  Extension 50, posterior pressure pain  Right lateral  flexion 42, R pressure pain  Left lateral flexion 43, R pulling pain  Right rotation 59, R pulling pain  Left rotation 72, R pulling pain   (Blank rows = not tested)   UPPER EXTREMITY ROM: Grossly WNLs and equal R and L Active ROM Right eval Left eval  Shoulder flexion      Shoulder extension      Shoulder abduction      Shoulder adduction      Shoulder extension      Shoulder internal rotation      Shoulder external rotation      Elbow flexion      Elbow extension      Wrist flexion      Wrist extension      Wrist ulnar deviation      Wrist radial deviation      Wrist pronation      Wrist supination       (Blank rows = not tested)   UPPER EXTREMITY MMT: Grossly 4+ to 5/5 and equal R and L MMT Right eval Left eval  Shoulder flexion      Shoulder extension      Shoulder abduction      Shoulder adduction      Shoulder extension      Shoulder internal rotation      Shoulder external rotation      Middle trapezius      Lower trapezius      Elbow flexion      Elbow extension      Wrist flexion      Wrist extension      Wrist ulnar deviation      Wrist radial deviation      Wrist pronation      Wrist supination      Grip strength       (Blank rows = not tested)   CERVICAL SPECIAL TESTS:  Spurling's test: Negative No radicular provocation, but a sensation of pressure pain to the R   FUNCTIONAL TESTS:  NT   TODAY'S TREATMENT:   [facility] Adult PT Treatment:                                                DATE: 07/30/22 Therapeutic Exercise: *** Manual Therapy: *** Neuromuscular re-ed: *** Therapeutic Activity: *** Modalities: *** Self Care: ***  [facility] Adult PT Treatment:                                                DATE: 07/28/22 Therapeutic Exercise: Supine chin tuck x10 5 sec DNF x5 5 sec Seated chin tuck x5 5" Seated bilat shoulder ER x15 RTB Seated shoulder hor abd  star pattern x5 RTB Standing open book x5 5" each Seated upper trap stretch x2 15" Seated cervical rotation x2 15" Updated HEP Manual Therapy: STM c MTPR to the bilat upper trap and levator Self care: For proper sitting posture and computer work station set up  [facility] Adult PT Treatment:                                                DATE: 07/21/22 Therapeutic Exercise: Developed, instructed in, and pt completed therex as noted in HEP    PATIENT EDUCATION:  Education details: Eval findings, POC, HEP Person educated: Patient Education method: Explanation, Demonstration, Tactile cues, Verbal cues, and Handouts Education comprehension: verbalized understanding, returned demonstration, verbal cues required, and tactile cues required   HOME EXERCISE PROGRAM: Access Code: [email or web address] URL: [email or web address] Date: 07/28/2022 Prepared by: Gar Ponto  Exercises - Seated Passive Cervical Retraction  - 4-6 x daily - 7 x weekly - 1 sets - 2-3 reps - 5 hold - Standing Cervical Rotation AROM with Overpressure  - 4-6 x daily - 7 x weekly - 1 sets - 2-3 reps - 10 hold - Seated Cervical Sidebending Stretch  - 4-6 x daily - 7 x weekly - 1 sets - 2-3 reps - 10 hold - Supine Cervical Retraction with Towel  - 1-2 x daily - 7 x weekly - 1 sets - 10 reps - 3 hold - Supine Deep Neck Flexor Training - Repetitions  - 1-2 x daily - 7 x weekly - 3  sets - 10 reps - 5 hold - Shoulder External Rotation and Scapular Retraction with Resistance  - 1 x daily - 7 x weekly - 2 sets - 10 reps - 3 hold - Standing Shoulder Horizontal Abduction with Resistance  - 1 x daily - 7 x weekly - 2 sets - 10 reps - 3 hold   ASSESSMENT:   CLINICAL IMPRESSION: PT was completed for STM c MTPR to the bilat upper traps and levator. Increased muscle tight was palpated L > R. Therex was then completed for cervical ROM and cervcial/upper back posterior chain/postural strengthening. Pt tolerated PT today without adverse effects. Pt will continue to benefit from skilled PT to address impairments for improved function. Will assess pt's response to initial PT interventio and her ability to complete her HEP.   OBJECTIVE IMPAIRMENTS: decreased activity tolerance, decreased ROM, decreased strength, increased muscle spasms, impaired UE functional use, postural dysfunction, obesity, and pain.    ACTIVITY LIMITATIONS: carrying, lifting, sitting, and caring for others   PARTICIPATION LIMITATIONS: meal prep, cleaning, laundry, community activity, occupation, and yard work   PERSONAL FACTORS: Fitness, Past/current experiences, Time since onset of injury/illness/exacerbation, and 1 comorbidity: tobacco use, high BMI  are also affecting patient's functional outcome.    GOALS:   SHORT TERM GOALS: Target date: 08/07/22   Pt will be Ind in an initial HEP Baseline: initiated Goal status: Ongoing   2.  Pt will voice understanding of measures to assist in pain reduction  Baseline: initiated Goal status: Ongoing   LONG TERM GOALS: Target date: 09/08/21   Pt will be Ind in a final HEP to maintain achieved LOF Baseline: initiated Goal status: INITIAL   2.  Increase cervical extension to 55d and R rotation to 70d for improved neck function an as indiation of decreased pain Baseline: 50 and 59d respectively Goal status: INITIAL   3.  Pt will report a decrease in neck pain to  3/10 or less and a 50% reduction in hte N/T of her R medial arm Baseline: 3-8/10 Goal status: INITIAL   4.  Pt's NDI score will improved to  the MCID value of 20% as indication of improved function  Baseline: 30% Goal status: INITIAL     PLAN:   PT FREQUENCY: 2x/week   PT DURATION: 6 weeks   PLANNED INTERVENTIONS: Therapeutic exercises, Therapeutic activity, Self Care, Aquatic Therapy, Dry Needling, Electrical stimulation, Spinal manipulation, Spinal mobilization, Cryotherapy, Moist heat, Taping, Traction, Ionotophoresis 4mg /ml Dexamethasone, Manual therapy, and Re-evaluation   PLAN FOR NEXT SESSION: Review FOTO; assess response to HEP; progress therex as indicated; use of modalities, manual therapy; and TPDN as indicated.   Netta Fodge MS, PT 07/29/22 9:53 PM

## 2022-07-30 ENCOUNTER — Telehealth: Payer: Self-pay

## 2022-07-30 ENCOUNTER — Ambulatory Visit: Payer: Medicaid Other

## 2022-07-30 NOTE — Telephone Encounter (Signed)
Called pt re: no show appt today. Advised pt re: attendance policy and her next appt. Pt voice understanding. Pt stated she made a mistake about her schedule.

## 2022-08-11 ENCOUNTER — Ambulatory Visit: Payer: Medicaid Other | Attending: Physician Assistant | Admitting: Physical Therapy

## 2022-08-11 DIAGNOSIS — R252 Cramp and spasm: Secondary | ICD-10-CM | POA: Diagnosis present

## 2022-08-11 DIAGNOSIS — M5412 Radiculopathy, cervical region: Secondary | ICD-10-CM | POA: Insufficient documentation

## 2022-08-11 DIAGNOSIS — M6281 Muscle weakness (generalized): Secondary | ICD-10-CM | POA: Diagnosis present

## 2022-08-11 DIAGNOSIS — M542 Cervicalgia: Secondary | ICD-10-CM | POA: Diagnosis present

## 2022-08-11 DIAGNOSIS — R208 Other disturbances of skin sensation: Secondary | ICD-10-CM | POA: Insufficient documentation

## 2022-08-11 DIAGNOSIS — R293 Abnormal posture: Secondary | ICD-10-CM | POA: Insufficient documentation

## 2022-08-11 NOTE — Therapy (Signed)
OUTPATIENT PHYSICAL THERAPY TREATMENT NOTE   Patient Name: Nancy Swanson MRN: [id number] DOB:12-27-1985, 37 y.o., female Today's Date: 08/11/2022  PCP: Norm Salt, PA   REFERRING PROVIDER: Vernetta Honey, PA-C   END OF SESSION:   PT End of Session - 08/11/22 1452     Visit Number 3    Number of Visits 13    Date for PT Re-Evaluation 09/08/22    Authorization Type [place] MEDICA[employer] [place]    Authorization - Visit Number 3    Authorization - Number of Visits 12    PT Start Time 1450    PT Stop Time 1528    PT Time Calculation (min) 38 min             Past Medical History:  Diagnosis Date   Acid reflux    Bartholin cyst    Past Surgical History:  Procedure Laterality Date   CESAREAN SECTION     CESAREAN SECTION  05/23/2011   Procedure: CESAREAN SECTION;  Surgeon: Roseanna Rainbow, MD;  Location: [facility];  Service: Gynecology;  Laterality: N/A;  Repeat cesarean section with delivery of baby boy at 70. Apgars 9/9.   CYSTECTOMY      glands removed from bil axilla due to cyst formation   sweat glands     removed from under arms   Patient Active Problem List   Diagnosis Date Noted   Previous cesarean section complicating pregnancy 05/23/2011   Cesarean delivery, without mention of indication, delivered, with or without mention of antepartum condition 05/23/2011   Vulval cellulitis 04/12/2011   Normal pregnancy 04/12/2011    REFERRING DIAG: M54.2 (ICD-10-CM) - Cervicalgia    THERAPY DIAG:  Cervicalgia  Cramp and spasm  Abnormal posture  Rationale for Evaluation and Treatment Rehabilitation  SUBJECTIVE:                                                                                                                                                                                                          SUBJECTIVE STATEMENT: I have a constant tension, no pain.    PAIN:  Are you having pain? Yes: NPRS scale: 0/10 Pain location:  Posterior neck Pain description: Tightness Aggravating factors: Sitting with poor posture, cleaning Relieving factors: Stretching On eval pain range: 3-8/10  PERTINENT HISTORY:  Tobacco use, high BMI   PRECAUTIONS: None   PATIENT GOALS: Decreased pain and better movement of her neck   NEXT MD VISIT:    OBJECTIVE: (objective measures completed at initial evaluation unless otherwise dated)  DIAGNOSTIC FINDINGS:  Not available in Epic   PATIENT SURVEYS:  NDI 30%   COGNITION: Overall cognitive status: Within functional limits for tasks assessed   SENSATION: WFL   POSTURE: forward head and increased lumbar lordosis   PALPATION: TTP midline CT junction and paraspinals of the area R>L          CERVICAL ROM:    Active ROM A/PROM (deg) eval  Flexion 52, posterior tight discomfort  Extension 50, posterior pressure pain  Right lateral flexion 42, R pressure pain  Left lateral flexion 43, R pulling pain  Right rotation 59, R pulling pain  Left rotation 72, R pulling pain   (Blank rows = not tested)   UPPER EXTREMITY ROM: Grossly WNLs and equal R and L Active ROM Right eval Left eval  Shoulder flexion      Shoulder extension      Shoulder abduction      Shoulder adduction      Shoulder extension      Shoulder internal rotation      Shoulder external rotation      Elbow flexion      Elbow extension      Wrist flexion      Wrist extension      Wrist ulnar deviation      Wrist radial deviation      Wrist pronation      Wrist supination       (Blank rows = not tested)   UPPER EXTREMITY MMT: Grossly 4+ to 5/5 and equal R and L MMT Right eval Left eval  Shoulder flexion      Shoulder extension      Shoulder abduction      Shoulder adduction      Shoulder extension      Shoulder internal rotation      Shoulder external rotation      Middle trapezius      Lower trapezius      Elbow flexion      Elbow extension      Wrist flexion      Wrist extension       Wrist ulnar deviation      Wrist radial deviation      Wrist pronation      Wrist supination      Grip strength       (Blank rows = not tested)   CERVICAL SPECIAL TESTS:  Spurling's test: Negative No radicular provocation, but a sensation of pressure pain to the R   FUNCTIONAL TESTS:  NT   TODAY'S TREATMENT:  [facility] Adult PT Treatment:                                                DATE: 08/11/22 Therapeutic Exercise: Standing Row 10 x 2 Green  Open book stretch at wall x 10 each  Supine chin tuck over towel roll DNF 5 sec x 10 Supine green band star pattern x 10 Supine shouler ER with Green band bilat 10 x 2 Manual Therapy: Passive cervical traction, upper trap stretches and levator stretches passively.   Self Care: Use of tennis ball vs theracane for self trigger point release   [facility] Adult PT Treatment:  DATE: 07/28/22 Therapeutic Exercise: Supine chin tuck x10 5 sec DNF x5 5 sec Seated chin tuck x5 5" Seated bilat shoulder ER x15 RTB Seated shoulder hor abd  star pattern x5 RTB Standing open book x5 5" each Seated upper trap stretch x2 15" Seated cervical rotation x2 15" Updated HEP Manual Therapy: STM c MTPR to the bilat upper trap and levator Self care: For proper sitting posture and computer work station set up                                                                                                 [facility] Adult PT Treatment:                                                DATE: 07/21/22 Therapeutic Exercise: Developed, instructed in, and pt completed therex as noted in HEP    PATIENT EDUCATION:  Education details: Eval findings, POC, HEP Person educated: Patient Education method: Explanation, Demonstration, Tactile cues, Verbal cues, and Handouts Education comprehension: verbalized understanding, returned demonstration, verbal cues required, and tactile cues required   HOME EXERCISE PROGRAM: Access Code:  [facility] URL: [email or web address] Date: 07/28/2022 Prepared by: Joellyn Rued  Exercises - Seated Passive Cervical Retraction  - 4-6 x daily - 7 x weekly - 1 sets - 2-3 reps - 5 hold - Standing Cervical Rotation AROM with Overpressure  - 4-6 x daily - 7 x weekly - 1 sets - 2-3 reps - 10 hold - Seated Cervical Sidebending Stretch  - 4-6 x daily - 7 x weekly - 1 sets - 2-3 reps - 10 hold - Supine Cervical Retraction with Towel  - 1-2 x daily - 7 x weekly - 1 sets - 10 reps - 3 hold - Supine Deep Neck Flexor Training - Repetitions  - 1-2 x daily - 7 x weekly - 3 sets - 10 reps - 5 hold - Shoulder External Rotation and Scapular Retraction with Resistance  - 1 x daily - 7 x weekly - 2 sets - 10 reps - 3 hold - Standing Shoulder Horizontal Abduction with Resistance  - 1 x daily - 7 x weekly - 2 sets - 10 reps - 3 hold   ASSESSMENT:   CLINICAL IMPRESSION: Pt reports she is about the same. She has a constant tension in the back of her neck and shoulders. Manual was performed to decrease upper trap and neck tension.   Therex was then completed for cervical ROM and cervcial/upper back posterior chain/postural strengthening. Instructed pt in use of theracane or tennis ball for self TPR. Pt reports a positive response at end of session.    OBJECTIVE IMPAIRMENTS: decreased activity tolerance, decreased ROM, decreased strength, increased muscle spasms, impaired UE functional use, postural dysfunction, obesity, and pain.    ACTIVITY LIMITATIONS: carrying, lifting, sitting, and caring for others   PARTICIPATION LIMITATIONS: meal prep, cleaning, laundry, community activity, occupation, and yard work   PERSONAL FACTORS: Fitness, Past/current experiences, Time  since onset of injury/illness/exacerbation, and 1 comorbidity: tobacco use, high BMI  are also affecting patient's functional outcome.    GOALS:   SHORT TERM GOALS: Target date: 08/07/22   Pt will be Ind in an initial HEP Baseline:  initiated Goal status: Ongoing   2.  Pt will voice understanding of measures to assist in pain reduction  Baseline: initiated Goal status: Ongoing   LONG TERM GOALS: Target date: 09/08/21   Pt will be Ind in a final HEP to maintain achieved LOF Baseline: initiated Goal status: INITIAL   2.  Increase cervical extension to 55d and R rotation to 70d for improved neck function an as indiation of decreased pain Baseline: 50 and 59d respectively Goal status: INITIAL   3.  Pt will report a decrease in neck pain to 3/10 or less and a 50% reduction in hte N/T of her R medial arm Baseline: 3-8/10 Goal status: INITIAL   4.  Pt's NDI score will improved to the MCID value of 20% as indication of improved function  Baseline: 30% Goal status: INITIAL     PLAN:   PT FREQUENCY: 2x/week   PT DURATION: 6 weeks   PLANNED INTERVENTIONS: Therapeutic exercises, Therapeutic activity, Self Care, Aquatic Therapy, Dry Needling, Electrical stimulation, Spinal manipulation, Spinal mobilization, Cryotherapy, Moist heat, Taping, Traction, Ionotophoresis 4mg /ml Dexamethasone, Manual therapy, and Re-evaluation   PLAN FOR NEXT SESSION: Review FOTO; assess response to HEP; progress therex as indicated; use of modalities, manual therapy; and TPDN as indicated.   Hessie Diener, PTA 08/11/22 3:36 PM Phone: [phone] Fax: [phone]

## 2022-08-13 ENCOUNTER — Encounter: Payer: Self-pay | Admitting: Physical Therapy

## 2022-08-13 ENCOUNTER — Ambulatory Visit: Payer: Medicaid Other | Admitting: Physical Therapy

## 2022-08-13 DIAGNOSIS — M542 Cervicalgia: Secondary | ICD-10-CM | POA: Diagnosis not present

## 2022-08-13 DIAGNOSIS — R252 Cramp and spasm: Secondary | ICD-10-CM

## 2022-08-13 DIAGNOSIS — R293 Abnormal posture: Secondary | ICD-10-CM

## 2022-08-13 NOTE — Therapy (Signed)
OUTPATIENT PHYSICAL THERAPY TREATMENT NOTE   Patient Name: Nancy Swanson MRN: [id number] DOB:[birth year], 37 y.o., female Today's Date: 08/13/2022  PCP: Norm Salt, PA   REFERRING PROVIDER: Vernetta Honey, PA-C   END OF SESSION:   PT End of Session - 08/13/22 1448     Visit Number 4    Number of Visits 13    Date for PT Re-Evaluation 09/08/22    Authorization Type [place] MEDICAID [employer] OF [place]    Authorization - Visit Number 4    Authorization - Number of Visits 12    PT Start Time 0246    PT Stop Time 0325    PT Time Calculation (min) 39 min             Past Medical History:  Diagnosis Date   Acid reflux    Bartholin cyst    Past Surgical History:  Procedure Laterality Date   CESAREAN SECTION     CESAREAN SECTION  05/23/2011   Procedure: CESAREAN SECTION;  Surgeon: Roseanna Rainbow, MD;  Location: [facility];  Service: Gynecology;  Laterality: N/A;  Repeat cesarean section with delivery of baby boy at 33. Apgars 9/9.   CYSTECTOMY      glands removed from bil axilla due to cyst formation   sweat glands     removed from under arms   Patient Active Problem List   Diagnosis Date Noted   Previous cesarean section complicating pregnancy 05/23/2011   Cesarean delivery, without mention of indication, delivered, with or without mention of antepartum condition 05/23/2011   Vulval cellulitis 04/12/2011   Normal pregnancy 04/12/2011    REFERRING DIAG: M54.2 (ICD-10-CM) - Cervicalgia    THERAPY DIAG:  Cervicalgia  Abnormal posture  Cramp and spasm  Rationale for Evaluation and Treatment Rehabilitation  SUBJECTIVE:                                                                                                                                                                                                          SUBJECTIVE STATEMENT: I am feeling less tension in the shoulders. I am doing the exercises in the morning which helps. I ordered a  theracane.     PAIN:  Are you having pain? Yes: NPRS scale: 0/10 Pain location: Posterior neck Pain description: Tightness Aggravating factors: Sitting with poor posture, cleaning Relieving factors: Stretching On eval pain range: 3-8/10  PERTINENT HISTORY:  Tobacco use, high BMI   PRECAUTIONS: None   PATIENT GOALS: Decreased pain and better movement of her neck  NEXT MD VISIT:    OBJECTIVE: (objective measures completed at initial evaluation unless otherwise dated)    DIAGNOSTIC FINDINGS:  Not available in Epic   PATIENT SURVEYS:  NDI 30%   COGNITION: Overall cognitive status: Within functional limits for tasks assessed   SENSATION: WFL   POSTURE: forward head and increased lumbar lordosis   PALPATION: TTP midline CT junction and paraspinals of the area R>L          CERVICAL ROM:    Active ROM A/PROM (deg) eval  Flexion 52, posterior tight discomfort  Extension 50, posterior pressure pain  Right lateral flexion 42, R pressure pain  Left lateral flexion 43, R pulling pain  Right rotation 59, R pulling pain  Left rotation 72, R pulling pain   (Blank rows = not tested)   UPPER EXTREMITY ROM: Grossly WNLs and equal R and L Active ROM Right eval Left eval  Shoulder flexion      Shoulder extension      Shoulder abduction      Shoulder adduction      Shoulder extension      Shoulder internal rotation      Shoulder external rotation      Elbow flexion      Elbow extension      Wrist flexion      Wrist extension      Wrist ulnar deviation      Wrist radial deviation      Wrist pronation      Wrist supination       (Blank rows = not tested)   UPPER EXTREMITY MMT: Grossly 4+ to 5/5 and equal R and L MMT Right eval Left eval  Shoulder flexion      Shoulder extension      Shoulder abduction      Shoulder adduction      Shoulder extension      Shoulder internal rotation      Shoulder external rotation      Middle trapezius      Lower trapezius       Elbow flexion      Elbow extension      Wrist flexion      Wrist extension      Wrist ulnar deviation      Wrist radial deviation      Wrist pronation      Wrist supination      Grip strength       (Blank rows = not tested)   CERVICAL SPECIAL TESTS:  Spurling's test: Negative No radicular provocation, but a sensation of pressure pain to the R   FUNCTIONAL TESTS:  NT   TODAY'S TREATMENT:  [facility] Adult PT Treatment:                                                DATE: 08/13/22 Therapeutic Exercise: UBE Level 1 3 min forward, 2 min back  Green Band Row x 20  Seated Chin tuck  Supine Chin tuck over towel Supine DNF 5 sec x 10  Supine Green band horizontals 10 x 2  Green band bilat ER 10 x 2  Open books x 10  each way  Manual Therapy: Passive cervical traction. Upper trap and  levator stretches bilaterally.    [facility] Adult PT Treatment:  DATE: 08/11/22 Therapeutic Exercise: Standing Row 10 x 2 Green  Open book stretch at wall x 10 each  Supine chin tuck over towel roll DNF 5 sec x 10 Supine green band star pattern x 10 Supine shouler ER with Green band bilat 10 x 2 Manual Therapy: Passive cervical traction, upper trap stretches and levator stretches passively.   Self Care: Use of tennis ball vs theracane for self trigger point release   [facility] Adult PT Treatment:                                                DATE: 07/28/22 Therapeutic Exercise: Supine chin tuck x10 5 sec DNF x5 5 sec Seated chin tuck x5 5" Seated bilat shoulder ER x15 RTB Seated shoulder hor abd  star pattern x5 RTB Standing open book x5 5" each Seated upper trap stretch x2 15" Seated cervical rotation x2 15" Updated HEP Manual Therapy: STM c MTPR to the bilat upper trap and levator Self care: For proper sitting posture and computer work station set up                                                                                                 [facility]  Adult PT Treatment:                                                DATE: 07/21/22 Therapeutic Exercise: Developed, instructed in, and pt completed therex as noted in HEP    PATIENT EDUCATION:  Education details: TPDN Person educated: Patient Education method: Explanation, Demonstration, Tactile cues, Verbal cues, and Handouts Education comprehension: verbalized understanding, returned demonstration, verbal cues required, and tactile cues required   HOME EXERCISE PROGRAM: Access Code: [facility] URL: [email or web address] Date: 07/28/2022 Prepared by: Gar Ponto  Exercises - Seated Passive Cervical Retraction  - 4-6 x daily - 7 x weekly - 1 sets - 2-3 reps - 5 hold - Standing Cervical Rotation AROM with Overpressure  - 4-6 x daily - 7 x weekly - 1 sets - 2-3 reps - 10 hold - Seated Cervical Sidebending Stretch  - 4-6 x daily - 7 x weekly - 1 sets - 2-3 reps - 10 hold - Supine Cervical Retraction with Towel  - 1-2 x daily - 7 x weekly - 1 sets - 10 reps - 3 hold - Supine Deep Neck Flexor Training - Repetitions  - 1-2 x daily - 7 x weekly - 3 sets - 10 reps - 5 hold - Shoulder External Rotation and Scapular Retraction with Resistance  - 1 x daily - 7 x weekly - 2 sets - 10 reps - 3 hold - Standing Shoulder Horizontal Abduction with Resistance  - 1 x daily - 7 x weekly - 2 sets - 10 reps - 3 hold   ASSESSMENT:   CLINICAL  IMPRESSION: Pt reports she is feeling better today and notes decreased tension in shoulders.  She has a constant tension in the back of her neck and shoulders. Manual was performed to decrease upper trap and neck tension.   Therex was then completed for cervical ROM and cervcial/upper back posterior chain/postural strengthening. Instructed pt in use of theracane or tennis ball for self TPR. Pt reports a positive response at end of session.    OBJECTIVE IMPAIRMENTS: decreased activity tolerance, decreased ROM, decreased strength, increased muscle spasms,  impaired UE functional use, postural dysfunction, obesity, and pain.    ACTIVITY LIMITATIONS: carrying, lifting, sitting, and caring for others   PARTICIPATION LIMITATIONS: meal prep, cleaning, laundry, community activity, occupation, and yard work   PERSONAL FACTORS: Fitness, Past/current experiences, Time since onset of injury/illness/exacerbation, and 1 comorbidity: tobacco use, high BMI  are also affecting patient's functional outcome.    GOALS:   SHORT TERM GOALS: Target date: 08/07/22   Pt will be Ind in an initial HEP Baseline: initiated Goal status: Ongoing   2.  Pt will voice understanding of measures to assist in pain reduction  Baseline: initiated Goal status: Ongoing   LONG TERM GOALS: Target date: 09/08/21   Pt will be Ind in a final HEP to maintain achieved LOF Baseline: initiated Goal status: INITIAL   2.  Increase cervical extension to 55d and R rotation to 70d for improved neck function an as indiation of decreased pain Baseline: 50 and 59d respectively Goal status: INITIAL   3.  Pt will report a decrease in neck pain to 3/10 or less and a 50% reduction in hte N/T of her R medial arm Baseline: 3-8/10 Goal status: INITIAL   4.  Pt's NDI score will improved to the MCID value of 20% as indication of improved function  Baseline: 30% Goal status: INITIAL     PLAN:   PT FREQUENCY: 2x/week   PT DURATION: 6 weeks   PLANNED INTERVENTIONS: Therapeutic exercises, Therapeutic activity, Self Care, Aquatic Therapy, Dry Needling, Electrical stimulation, Spinal manipulation, Spinal mobilization, Cryotherapy, Moist heat, Taping, Traction, Ionotophoresis 4mg /ml Dexamethasone, Manual therapy, and Re-evaluation   PLAN FOR NEXT SESSION:  assess response to HEP; progress therex as indicated; use of modalities, manual therapy; and TPDN as indicated.   Hessie Diener, PTA 08/13/22 3:31 PM Phone: [phone] Fax: [phone]

## 2022-08-13 NOTE — Patient Instructions (Signed)

## 2022-08-18 ENCOUNTER — Ambulatory Visit: Payer: Medicaid Other | Admitting: Physical Therapy

## 2022-08-18 ENCOUNTER — Encounter: Payer: Self-pay | Admitting: Physical Therapy

## 2022-08-18 DIAGNOSIS — M542 Cervicalgia: Secondary | ICD-10-CM

## 2022-08-18 DIAGNOSIS — R293 Abnormal posture: Secondary | ICD-10-CM

## 2022-08-18 DIAGNOSIS — R252 Cramp and spasm: Secondary | ICD-10-CM

## 2022-08-18 NOTE — Therapy (Signed)
OUTPATIENT PHYSICAL THERAPY TREATMENT NOTE   Patient Name: Nancy Swanson MRN: [id number] DOB:Mar 19, 1986, 37 y.o., female Today's Date: 08/18/2022  PCP: Norm Salt, PA   REFERRING PROVIDER: Vernetta Honey, PA-C   END OF SESSION:   PT End of Session - 08/18/22 1446     Visit Number 5    Number of Visits 13    Date for PT Re-Evaluation 09/08/22    Authorization Type [place] MEDICAID [employer] [place]    Authorization - Visit Number 5    Authorization - Number of Visits 12    PT Start Time 0243    PT Stop Time 0315    PT Time Calculation (min) 32 min             Past Medical History:  Diagnosis Date   Acid reflux    Bartholin cyst    Past Surgical History:  Procedure Laterality Date   CESAREAN SECTION     CESAREAN SECTION  05/23/2011   Procedure: CESAREAN SECTION;  Surgeon: Roseanna Rainbow, MD;  Location: [facility];  Service: Gynecology;  Laterality: N/A;  Repeat cesarean section with delivery of baby boy at 11. Apgars 9/9.   CYSTECTOMY      glands removed from bil axilla due to cyst formation   sweat glands     removed from under arms   Patient Active Problem List   Diagnosis Date Noted   Previous cesarean section complicating pregnancy 05/23/2011   Cesarean delivery, without mention of indication, delivered, with or without mention of antepartum condition 05/23/2011   Vulval cellulitis 04/12/2011   Normal pregnancy 04/12/2011    REFERRING DIAG: M54.2 (ICD-10-CM) - Cervicalgia    THERAPY DIAG:  Cervicalgia  Abnormal posture  Cramp and spasm  Rationale for Evaluation and Treatment Rehabilitation  SUBJECTIVE:                                                                                                                                                                                                          SUBJECTIVE STATEMENT: I still have some tensio, No pain. I use the theracane and the tennis ball. I do the exercises.    PAIN:  Are  you having pain? Yes: NPRS scale: 0/10 Pain location: Posterior neck Pain description: Tightness Aggravating factors: Sitting with poor posture, cleaning Relieving factors: Stretching On eval pain range: 3-8/10  PERTINENT HISTORY:  Tobacco use, high BMI   PRECAUTIONS: None   PATIENT GOALS: Decreased pain and better movement of her neck   NEXT MD VISIT:  OBJECTIVE: (objective measures completed at initial evaluation unless otherwise dated)    DIAGNOSTIC FINDINGS:  Not available in Epic   PATIENT SURVEYS:  NDI 30%   COGNITION: Overall cognitive status: Within functional limits for tasks assessed   SENSATION: WFL   POSTURE: forward head and increased lumbar lordosis   PALPATION: TTP midline CT junction and paraspinals of the area R>L          CERVICAL ROM:    Active ROM A/PROM (deg) eval AROM 08/18/22  Flexion 52, posterior tight discomfort 50 stretch posterior neck  Extension 50, posterior pressure pain 40   Right lateral flexion 42, R pressure pain 40p!  Left lateral flexion 43, R pulling pain 40  Right rotation 59, R pulling pain 65  Left rotation 72, R pulling pain 65   (Blank rows = not tested)   UPPER EXTREMITY ROM: Grossly WNLs and equal R and L Active ROM Right eval Left eval  Shoulder flexion      Shoulder extension      Shoulder abduction      Shoulder adduction      Shoulder extension      Shoulder internal rotation      Shoulder external rotation      Elbow flexion      Elbow extension      Wrist flexion      Wrist extension      Wrist ulnar deviation      Wrist radial deviation      Wrist pronation      Wrist supination       (Blank rows = not tested)   UPPER EXTREMITY MMT: Grossly 4+ to 5/5 and equal R and L MMT Right eval Left eval  Shoulder flexion      Shoulder extension      Shoulder abduction      Shoulder adduction      Shoulder extension      Shoulder internal rotation      Shoulder external rotation      Middle  trapezius      Lower trapezius      Elbow flexion      Elbow extension      Wrist flexion      Wrist extension      Wrist ulnar deviation      Wrist radial deviation      Wrist pronation      Wrist supination      Grip strength       (Blank rows = not tested)   CERVICAL SPECIAL TESTS:  Spurling's test: Negative No radicular provocation, but a sensation of pressure pain to the R   FUNCTIONAL TESTS:  NT   TODAY'S TREATMENT:  [facility] Adult PT Treatment:                                                DATE: 08/18/22 Therapeutic Exercise: UBE level 2 3 min each way Pec stretch in doorway  Standing rows green band  Shoulder ER with scap retract x 15 Green band seated star pattern x 15 - cues for chin tuck Upper trap stretch Levator stretch AROM Yoga mat horizontal to spine under scap- thoracic extension with UE pullovers Open books x 10 each Supine DNF 10 sec x 5    [facility] Adult PT Treatment:  DATE: 08/13/22 Therapeutic Exercise: UBE Level 1 3 min forward, 2 min back  Green Band Row x 20  Seated Chin tuck  Supine Chin tuck over towel Supine DNF 5 sec x 10  Supine Green band horizontals 10 x 2  Green band bilat ER 10 x 2  Open books x 10  each way  Manual Therapy: Passive cervical traction. Upper trap and  levator stretches bilaterally.    [facility] Adult PT Treatment:                                                DATE: 08/11/22 Therapeutic Exercise: Standing Row 10 x 2 Green  Open book stretch at wall x 10 each  Supine chin tuck over towel roll DNF 5 sec x 10 Supine green band star pattern x 10 Supine shouler ER with Green band bilat 10 x 2 Manual Therapy: Passive cervical traction, upper trap stretches and levator stretches passively.   Self Care: Use of tennis ball vs theracane for self trigger point release   [facility] Adult PT Treatment:                                                DATE: 07/28/22 Therapeutic Exercise: Supine  chin tuck x10 5 sec DNF x5 5 sec Seated chin tuck x5 5" Seated bilat shoulder ER x15 RTB Seated shoulder hor abd  star pattern x5 RTB Standing open book x5 5" each Seated upper trap stretch x2 15" Seated cervical rotation x2 15" Updated HEP Manual Therapy: STM c MTPR to the bilat upper trap and levator Self care: For proper sitting posture and computer work station set up                                                                                                 [facility] Adult PT Treatment:                                                DATE: 07/21/22 Therapeutic Exercise: Developed, instructed in, and pt completed therex as noted in HEP    PATIENT EDUCATION:  Education details: TPDN Person educated: Patient Education method: Explanation, Demonstration, Tactile cues, Verbal cues, and Handouts Education comprehension: verbalized understanding, returned demonstration, verbal cues required, and tactile cues required   HOME EXERCISE PROGRAM: Access Code: [facility] URL: [email or web address] Date: 07/28/2022 Prepared by: Joellyn Rued  Exercises - Seated Passive Cervical Retraction  - 4-6 x daily - 7 x weekly - 1 sets - 2-3 reps - 5 hold - Standing Cervical Rotation AROM with Overpressure  - 4-6 x daily - 7 x weekly - 1 sets - 2-3 reps - 10 hold - Seated Cervical Sidebending Stretch  -  4-6 x daily - 7 x weekly - 1 sets - 2-3 reps - 10 hold - Supine Cervical Retraction with Towel  - 1-2 x daily - 7 x weekly - 1 sets - 10 reps - 3 hold - Supine Deep Neck Flexor Training - Repetitions  - 1-2 x daily - 7 x weekly - 3 sets - 10 reps - 5 hold - Shoulder External Rotation and Scapular Retraction with Resistance  - 1 x daily - 7 x weekly - 2 sets - 10 reps - 3 hold - Standing Shoulder Horizontal Abduction with Resistance  - 1 x daily - 7 x weekly - 2 sets - 10 reps - 3 hold   ASSESSMENT:   CLINICAL IMPRESSION: Pt reports no pain on arrival but tension remains in upper traps  and between shoulder blades. She is using theracane and tennis ball at home. Her AROM has improved  Progressed thoracic mobility with thoracic ext stretching over rolled mat. Suggested TPDN as possible treatment next session to address continued tension in upper traps.     OBJECTIVE IMPAIRMENTS: decreased activity tolerance, decreased ROM, decreased strength, increased muscle spasms, impaired UE functional use, postural dysfunction, obesity, and pain.    ACTIVITY LIMITATIONS: carrying, lifting, sitting, and caring for others   PARTICIPATION LIMITATIONS: meal prep, cleaning, laundry, community activity, occupation, and yard work   PERSONAL FACTORS: Fitness, Past/current experiences, Time since onset of injury/illness/exacerbation, and 1 comorbidity: tobacco use, high BMI  are also affecting patient's functional outcome.    GOALS:   SHORT TERM GOALS: Target date: 08/07/22   Pt will be Ind in an initial HEP Baseline: initiated Goal status: MET 08/18/22   2.  Pt will voice understanding of measures to assist in pain reduction  Baseline: initiated 08/18/22: theracane, tennis ball, HEP Goal status:MET   LONG TERM GOALS: Target date: 09/08/21   Pt will be Ind in a final HEP to maintain achieved LOF Baseline: initiated Goal status: INITIAL   2.  Increase cervical extension to 55d and R rotation to 70d for improved neck function an as indiation of decreased pain Baseline: 50 and 59d respectively Goal status: INITIAL   3.  Pt will report a decrease in neck pain to 3/10 or less and a 50% reduction in hte N/T of her R medial arm Baseline: 3-8/10 Goal status: INITIAL   4.  Pt's NDI score will improved to the MCID value of 20% as indication of improved function  Baseline: 30% Goal status: INITIAL     PLAN:   PT FREQUENCY: 2x/week   PT DURATION: 6 weeks   PLANNED INTERVENTIONS: Therapeutic exercises, Therapeutic activity, Self Care, Aquatic Therapy, Dry Needling, Electrical stimulation,  Spinal manipulation, Spinal mobilization, Cryotherapy, Moist heat, Taping, Traction, Ionotophoresis 4mg /ml Dexamethasone, Manual therapy, and Re-evaluation   PLAN FOR NEXT SESSION:  Try TPDN next if agreeable ; assess response to HEP; progress therex as indicated; use of modalities, manual therapy; and TPDN as indicated.   , PTA 08/18/22 3:12 PM Phone: [phone] Fax: [phone]

## 2022-08-19 NOTE — Therapy (Signed)
OUTPATIENT PHYSICAL THERAPY TREATMENT NOTE   Patient Name: Nancy Swanson MRN: [id number] DOB:[birth year], 37 y.o., female Today's Date: 08/20/2022  PCP: Trey Sailors, PA   REFERRING PROVIDER: Ethelda Chick, PA-C   END OF SESSION:   PT End of Session - 08/20/22 1512     Visit Number 6    Number of Visits 13    Date for PT Re-Evaluation 09/08/22    Authorization Type [place] MEDICAID [employer] OF [place]    Authorization - Visit Number 6    Authorization - Number of Visits 12    PT Start Time 1510    PT Stop Time 1550    PT Time Calculation (min) 40 min    Activity Tolerance Patient tolerated treatment well    Behavior During Therapy WFL for tasks assessed/performed              Past Medical History:  Diagnosis Date   Acid reflux    Bartholin cyst    Past Surgical History:  Procedure Laterality Date   CESAREAN SECTION     CESAREAN SECTION  05/23/2011   Procedure: CESAREAN SECTION;  Surgeon: Agnes Lawrence, MD;  Location: [facility];  Service: Gynecology;  Laterality: N/A;  Repeat cesarean section with delivery of baby boy at 53. Apgars 9/9.   CYSTECTOMY      glands removed from bil axilla due to cyst formation   sweat glands     removed from under arms   Patient Active Problem List   Diagnosis Date Noted   Previous cesarean section complicating pregnancy 40/34/7425   Cesarean delivery, without mention of indication, delivered, with or without mention of antepartum condition 05/23/2011   Vulval cellulitis 04/12/2011   Normal pregnancy 04/12/2011    REFERRING DIAG: M54.2 (ICD-10-CM) - Cervicalgia    THERAPY DIAG:  Cervicalgia  Abnormal posture  Cramp and spasm  Rationale for Evaluation and Treatment Rehabilitation  SUBJECTIVE:                                                                                                                                                                                                          SUBJECTIVE  STATEMENT: Pt reports she is doing well, experiencing upper shoulder and mid back tightness but no pain. Pt reports she is sleeping better.   PAIN:  Are you having pain? Yes: NPRS scale: 0/10 Pain location: Posterior neck Pain description: Tightness Aggravating factors: Sitting with poor posture, cleaning Relieving factors: Stretching On eval pain range: 3-8/10  PERTINENT HISTORY:  Tobacco use, high BMI  PRECAUTIONS: None   PATIENT GOALS: Decreased pain and better movement of her neck   NEXT MD VISIT:    OBJECTIVE: (objective measures completed at initial evaluation unless otherwise dated)    DIAGNOSTIC FINDINGS:  Not available in Epic   PATIENT SURVEYS:  NDI 30%   COGNITION: Overall cognitive status: Within functional limits for tasks assessed   SENSATION: WFL   POSTURE: forward head and increased lumbar lordosis   PALPATION: TTP midline CT junction and paraspinals of the area R>L          CERVICAL ROM:    Active ROM A/PROM (deg) eval AROM 08/18/22 AROM 08/20/22  Flexion 52, posterior tight discomfort 50 stretch posterior neck   Extension 50, posterior pressure pain 40  65  Right lateral flexion 42, R pressure pain 40p!   Left lateral flexion 43, R pulling pain 40   Right rotation 59, R pulling pain 65 70  Left rotation 72, R pulling pain 65 65   (Blank rows = not tested)   UPPER EXTREMITY ROM: Grossly WNLs and equal R and L Active ROM Right eval Left eval  Shoulder flexion      Shoulder extension      Shoulder abduction      Shoulder adduction      Shoulder extension      Shoulder internal rotation      Shoulder external rotation      Elbow flexion      Elbow extension      Wrist flexion      Wrist extension      Wrist ulnar deviation      Wrist radial deviation      Wrist pronation      Wrist supination       (Blank rows = not tested)   UPPER EXTREMITY MMT: Grossly 4+ to 5/5 and equal R and L MMT Right eval Left eval  Shoulder flexion       Shoulder extension      Shoulder abduction      Shoulder adduction      Shoulder extension      Shoulder internal rotation      Shoulder external rotation      Middle trapezius      Lower trapezius      Elbow flexion      Elbow extension      Wrist flexion      Wrist extension      Wrist ulnar deviation      Wrist radial deviation      Wrist pronation      Wrist supination      Grip strength       (Blank rows = not tested)   CERVICAL SPECIAL TESTS:  Spurling's test: Negative No radicular provocation, but a sensation of pressure pain to the R   FUNCTIONAL TESTS:  NT   TODAY'S TREATMENT:  [facility] Adult PT Treatment:                                                DATE: 08/20/22 Therapeutic Exercise: UBE level 2 3 min each way Standing chin tuck into a folded towel x10 3" Standing shoulder ER with scap retract 2x10 GTB Standing shoulder horizonal star pattern 2x5 for pattern GTB Upper trap stretch c pt over pressure x2 15" each Side bending stretch c  chin tuck x2 15" each Cervical rotation c pt over pressure x2 15" each Pec stretch in doorway 90d x2 30d Open books x 5 each 5"  [facility] Adult PT Treatment:                                                DATE: 08/18/22 Therapeutic Exercise: UBE level 2 3 min each way Pec stretch in doorway  Standing rows green band  Shoulder ER with scap retract x 15 Green band seated star pattern x 15 - cues for chin tuck Upper trap stretch Levator stretch AROM Yoga mat horizontal to spine under scap- thoracic extension with UE pullovers Open books x 10 each Supine DNF 10 sec x 5   [facility] Adult PT Treatment:                                                DATE: 08/13/22 Therapeutic Exercise: UBE Level 1 3 min forward, 2 min back  Green Band Row x 20  Seated Chin tuck  Supine Chin tuck over towel Supine DNF 5 sec x 10  Supine Green band horizontals 10 x 2  Green band bilat ER 10 x 2  Open books x 10  each way  Manual Therapy: Passive  cervical traction. Upper trap and  levator stretches bilaterally.                                                                                                     PATIENT EDUCATION:  Education details: TPDN Person educated: Patient Education method: Explanation, Demonstration, Tactile cues, Verbal cues, and Handouts Education comprehension: verbalized understanding, returned demonstration, verbal cues required, and tactile cues required   HOME EXERCISE PROGRAM: Access Code: [email or web address] URL: [email or web address] Date: 07/28/2022 Prepared by: Gar Ponto  Exercises - Seated Passive Cervical Retraction  - 4-6 x daily - 7 x weekly - 1 sets - 2-3 reps - 5 hold - Standing Cervical Rotation AROM with Overpressure  - 4-6 x daily - 7 x weekly - 1 sets - 2-3 reps - 10 hold - Seated Cervical Sidebending Stretch  - 4-6 x daily - 7 x weekly - 1 sets - 2-3 reps - 10 hold - Supine Cervical Retraction with Towel  - 1-2 x daily - 7 x weekly - 1 sets - 10 reps - 3 hold - Supine Deep Neck Flexor Training - Repetitions  - 1-2 x daily - 7 x weekly - 3 sets - 10 reps - 5 hold - Shoulder External Rotation and Scapular Retraction with Resistance  - 1 x daily - 7 x weekly - 2 sets - 10 reps - 3 hold - Standing Shoulder Horizontal Abduction with Resistance  - 1 x daily - 7 x weekly - 2 sets - 10 reps - 3 hold   ASSESSMENT:  CLINICAL IMPRESSION: Discussed with pt the side effects and benefits of TPDN. Pt is still considering having it completed. PT was completed for cervical and thoracic flexibility and for cervical and posterior chain strengthening. Cervical ROM was assessed at end of the session with extension and Rt rotation ROM improved. Pt's subjective report continues to indicate improvement in pain and function (sleeping better). Pt is making appropriate progress. Pt will continue to benefit from skilled PT to address impairments for improved function of her neck with less pain.   OBJECTIVE  IMPAIRMENTS: decreased activity tolerance, decreased ROM, decreased strength, increased muscle spasms, impaired UE functional use, postural dysfunction, obesity, and pain.    ACTIVITY LIMITATIONS: carrying, lifting, sitting, and caring for others   PARTICIPATION LIMITATIONS: meal prep, cleaning, laundry, community activity, occupation, and yard work   PERSONAL FACTORS: Fitness, Past/current experiences, Time since onset of injury/illness/exacerbation, and 1 comorbidity: tobacco use, high BMI  are also affecting patient's functional outcome.    GOALS:   SHORT TERM GOALS: Target date: 08/07/22   Pt will be Ind in an initial HEP Baseline: initiated Goal status: MET 08/18/22   2.  Pt will voice understanding of measures to assist in pain reduction  Baseline: initiated 08/18/22: theracane, tennis ball, HEP Goal status:MET   LONG TERM GOALS: Target date: 09/08/21   Pt will be Ind in a final HEP to maintain achieved LOF Baseline: initiated Goal status: INITIAL   2.  Increase cervical extension to 55d and R rotation to 70d for improved neck function an as indication of decreased pain Baseline: 50 and 59d respectively Status: 08/20/22 = see flowsheets Goal status: INITIAL   3.  Pt will report a decrease in neck pain to 3/10 or less and a 50% reduction in hte N/T of her R medial arm Baseline: 3-8/10 Status: 08/20/22=0/10 Goal status: Improving   4.  Pt's NDI score will improved to the MCID value of 20% as indication of improved function  Baseline: 30% Goal status: INITIAL     PLAN:   PT FREQUENCY: 2x/week   PT DURATION: 6 weeks   PLANNED INTERVENTIONS: Therapeutic exercises, Therapeutic activity, Self Care, Aquatic Therapy, Dry Needling, Electrical stimulation, Spinal manipulation, Spinal mobilization, Cryotherapy, Moist heat, Taping, Traction, Ionotophoresis 4mg /ml Dexamethasone, Manual therapy, and Re-evaluation   PLAN FOR NEXT SESSION:  Try TPDN next if agreeable ; assess response  to HEP; progress therex as indicated; use of modalities, manual therapy; and TPDN as indicated. Reassess NDI.   Juniper Snyders MS, PT 08/20/22 4:09 PM

## 2022-08-20 ENCOUNTER — Ambulatory Visit: Payer: Medicaid Other

## 2022-08-20 DIAGNOSIS — R252 Cramp and spasm: Secondary | ICD-10-CM

## 2022-08-20 DIAGNOSIS — M542 Cervicalgia: Secondary | ICD-10-CM | POA: Diagnosis not present

## 2022-08-20 DIAGNOSIS — R293 Abnormal posture: Secondary | ICD-10-CM

## 2022-08-24 ENCOUNTER — Ambulatory Visit: Payer: Medicaid Other | Admitting: Physical Therapy

## 2022-08-24 ENCOUNTER — Telehealth: Payer: Self-pay | Admitting: Physical Therapy

## 2022-08-24 NOTE — Telephone Encounter (Signed)
Left voicemail with patient regarding missed appointment. Left next appointment date and time.

## 2022-08-26 ENCOUNTER — Ambulatory Visit: Payer: Medicaid Other | Admitting: Physical Therapy

## 2022-08-26 ENCOUNTER — Encounter: Payer: Self-pay | Admitting: Physical Therapy

## 2022-08-26 DIAGNOSIS — M6281 Muscle weakness (generalized): Secondary | ICD-10-CM

## 2022-08-26 DIAGNOSIS — M5412 Radiculopathy, cervical region: Secondary | ICD-10-CM

## 2022-08-26 DIAGNOSIS — M542 Cervicalgia: Secondary | ICD-10-CM | POA: Diagnosis not present

## 2022-08-26 DIAGNOSIS — R252 Cramp and spasm: Secondary | ICD-10-CM

## 2022-08-26 DIAGNOSIS — R293 Abnormal posture: Secondary | ICD-10-CM

## 2022-08-26 DIAGNOSIS — R208 Other disturbances of skin sensation: Secondary | ICD-10-CM

## 2022-08-26 NOTE — Therapy (Addendum)
OUTPATIENT PHYSICAL THERAPY TREATMENT NOTE/Discharge   Patient Name: Nancy Swanson MRN: [id number] DOB:[birth year], 37 y.o., female Today's Date: 08/26/2022  PCP: Trey Sailors, PA   REFERRING PROVIDER: Ethelda Chick, PA-C   END OF SESSION:   PT End of Session - 08/26/22 1406     Visit Number 7    Number of Visits 13    Date for PT Re-Evaluation 09/08/22    Authorization Type [place] [employer] [place]    Authorization - Visit Number 7    Authorization - Number of Visits 12    PT Start Time [id number]    PT Stop Time 1432    PT Time Calculation (min) 29 min              Past Medical History:  Diagnosis Date   Acid reflux    Bartholin cyst    Past Surgical History:  Procedure Laterality Date   CESAREAN SECTION     CESAREAN SECTION  05/23/2011   Procedure: CESAREAN SECTION;  Surgeon: Agnes Lawrence, MD;  Location: [facility];  Service: Gynecology;  Laterality: N/A;  Repeat cesarean section with delivery of baby boy at 26. Apgars 9/9.   CYSTECTOMY      glands removed from bil axilla due to cyst formation   sweat glands     removed from under arms   Patient Active Problem List   Diagnosis Date Noted   Previous cesarean section complicating pregnancy [id number]   Cesarean delivery, without mention of indication, delivered, with or without mention of antepartum condition 05/23/2011   Vulval cellulitis 04/12/2011   Normal pregnancy 04/12/2011    REFERRING DIAG: M54.2 (ICD-10-CM) - Cervicalgia    THERAPY DIAG:  Cervicalgia  Abnormal posture  Cramp and spasm  Other disturbances of skin sensation  Radiculopathy, cervical region  Muscle weakness (generalized)  Rationale for Evaluation and Treatment Rehabilitation  SUBJECTIVE:                                                                                                                                                                                                          SUBJECTIVE  STATEMENT: My exercises are really helping. I have decided I do not want to try the needling. Tightness is not as bad. Pain is never more than a 2/10.    PAIN:  Are you having pain? Yes: NPRS scale: 0/10 Pain location: Posterior neck Pain description: Tightness Aggravating factors: Sitting with poor posture, cleaning Relieving factors: Stretching On eval pain range: 3-8/10; 08/26/22: at most 2/10 lately  PERTINENT HISTORY:  Tobacco use, high BMI   PRECAUTIONS: None   PATIENT GOALS: Decreased pain and better movement of her neck   NEXT MD VISIT:    OBJECTIVE: (objective measures completed at initial evaluation unless otherwise dated)    DIAGNOSTIC FINDINGS:  Not available in Epic   PATIENT SURVEYS:  NDI 30% NDI 4% (2/50)    COGNITION: Overall cognitive status: Within functional limits for tasks assessed   SENSATION: WFL   POSTURE: forward head and increased lumbar lordosis   PALPATION: TTP midline CT junction and paraspinals of the area R>L          CERVICAL ROM:    Active ROM A/PROM (deg) eval AROM 08/18/22 AROM 08/20/22 AROM 08/26/22  Flexion 52, posterior tight discomfort 50 stretch posterior neck    Extension 50, posterior pressure pain 40  65   Right lateral flexion 42, R pressure pain 40p!    Left lateral flexion 43, R pulling pain 40    Right rotation 59, R pulling pain 65 70 75  Left rotation 72, R pulling pain 65 65 75   (Blank rows = not tested)   UPPER EXTREMITY ROM: Grossly WNLs and equal R and L Active ROM Right eval Left eval  Shoulder flexion      Shoulder extension      Shoulder abduction      Shoulder adduction      Shoulder extension      Shoulder internal rotation      Shoulder external rotation      Elbow flexion      Elbow extension      Wrist flexion      Wrist extension      Wrist ulnar deviation      Wrist radial deviation      Wrist pronation      Wrist supination       (Blank rows = not tested)   UPPER EXTREMITY  MMT: Grossly 4+ to 5/5 and equal R and L MMT Right eval Left eval  Shoulder flexion      Shoulder extension      Shoulder abduction      Shoulder adduction      Shoulder extension      Shoulder internal rotation      Shoulder external rotation      Middle trapezius      Lower trapezius      Elbow flexion      Elbow extension      Wrist flexion      Wrist extension      Wrist ulnar deviation      Wrist radial deviation      Wrist pronation      Wrist supination      Grip strength       (Blank rows = not tested)   CERVICAL SPECIAL TESTS:  Spurling's test: Negative No radicular provocation, but a sensation of pressure pain to the R   FUNCTIONAL TESTS:  NT   TODAY'S TREATMENT:  [facility] Adult PT Treatment:                                                DATE: 08/26/22 Therapeutic Exercise: Blue Band Rows Green band star Green band ER Levator stretch Pec stretch in doorway  Open book at wall v supine Verbal review and condense  on initial HEP    [facility] Adult PT Treatment:                                                DATE: 08/20/22 Therapeutic Exercise: UBE level 2 3 min each way Standing chin tuck into a folded towel x10 3" Standing shoulder ER with scap retract 2x10 GTB Standing shoulder horizonal star pattern 2x5 for pattern GTB Upper trap stretch c pt over pressure x2 15" each Side bending stretch c chin tuck x2 15" each Cervical rotation c pt over pressure x2 15" each Pec stretch in doorway 90d x2 30d Open books x 5 each 5"  [facility] Adult PT Treatment:                                                DATE: 08/18/22 Therapeutic Exercise: UBE level 2 3 min each way Pec stretch in doorway  Standing rows green band  Shoulder ER with scap retract x 15 Green band seated star pattern x 15 - cues for chin tuck Upper trap stretch Levator stretch AROM Yoga mat horizontal to spine under scap- thoracic extension with UE pullovers Open books x 10 each Supine DNF 10 sec x 5    [facility] Adult PT Treatment:                                                DATE: 08/13/22 Therapeutic Exercise: UBE Level 1 3 min forward, 2 min back  Green Band Row x 20  Seated Chin tuck  Supine Chin tuck over towel Supine DNF 5 sec x 10  Supine Green band horizontals 10 x 2  Green band bilat ER 10 x 2  Open books x 10  each way  Manual Therapy: Passive cervical traction. Upper trap and  levator stretches bilaterally.                                                                                                     PATIENT EDUCATION:  Education details: TPDN Person educated: Patient Education method: Explanation, Demonstration, Tactile cues, Verbal cues, and Handouts Education comprehension: verbalized understanding, returned demonstration, verbal cues required, and tactile cues required   HOME EXERCISE PROGRAM: Access Code: [email or web address] URL: [email or web address] Date: 07/28/2022 Prepared by: Gar Ponto  Exercises - Seated Passive Cervical Retraction  - 4-6 x daily - 7 x weekly - 1 sets - 2-3 reps - 5 hold - Standing Cervical Rotation AROM with Overpressure  - 4-6 x daily - 7 x weekly - 1 sets - 2-3 reps - 10 hold - Seated Cervical Sidebending Stretch  - 4-6 x daily - 7 x weekly - 1 sets - 2-3 reps -  10 hold - Supine Cervical Retraction with Towel  - 1-2 x daily - 7 x weekly - 1 sets - 10 reps - 3 hold - Supine Deep Neck Flexor Training - Repetitions  - 1-2 x daily - 7 x weekly - 3 sets - 10 reps - 5 hold - Shoulder External Rotation and Scapular Retraction with Resistance  - 1 x daily - 7 x weekly - 2 sets - 10 reps - 3 hold - Standing Shoulder Horizontal Abduction with Resistance  - 1 x daily - 7 x weekly - 2 sets - 10 reps - 3 hold Added 08/26/22 - Alternating star pattern  - 1 x daily - 7 x weekly - 2 sets - 10 reps - Standing Row with Anchored Resistance  - 1 x daily - 7 x weekly - 3 sets - 10 reps - Sidelying Open Book Thoracic Lumbar Rotation and Extension   - 1 x daily - 7 x weekly - 1 sets - 3 reps - 10-20 hold   ASSESSMENT:   CLINICAL IMPRESSION: Pt has decided she does not want to try TPDN. She reports significant improvement in pain and tension. Her pain is no longer higher than a 2/10. She has purchased a theracane and has a tennis ball for self massage/ TPR. Her AROM for rotation is 75 degrees bilateral and her NDI has improved to 4% disability. At this time she has met all LTGs and is  pleased with current LOF. She is agreeable to discharge to HEP. Time spent with review of HEP and issued additional therabands.     OBJECTIVE IMPAIRMENTS: decreased activity tolerance, decreased ROM, decreased strength, increased muscle spasms, impaired UE functional use, postural dysfunction, obesity, and pain.    ACTIVITY LIMITATIONS: carrying, lifting, sitting, and caring for others   PARTICIPATION LIMITATIONS: meal prep, cleaning, laundry, community activity, occupation, and yard work   PERSONAL FACTORS: Fitness, Past/current experiences, Time since onset of injury/illness/exacerbation, and 1 comorbidity: tobacco use, high BMI  are also affecting patient's functional outcome.    GOALS:   SHORT TERM GOALS: Target date: 08/07/22   Pt will be Ind in an initial HEP Baseline: initiated Goal status: MET 08/18/22   2.  Pt will voice understanding of measures to assist in pain reduction  Baseline: initiated 08/18/22: theracane, tennis ball, HEP Goal status:MET   LONG TERM GOALS: Target date: 09/08/21   Pt will be Ind in a final HEP to maintain achieved LOF Baseline: initiated Goal status: MET   2.  Increase cervical extension to 55d and R rotation to 70d for improved neck function an as indication of decreased pain Baseline: 50 and 59d respectively Status: 08/20/22 = see flowsheets 08/26/22: 75 bilat Goal status: MET   3.  Pt will report a decrease in neck pain to 3/10 or less and a 50% reduction in hte N/T of her R medial arm Baseline:  3-8/10 Status: 08/20/22=0/10 08/26/22: n/t has resoved completely. , pain at most 2/10 Goal status: MET    4.  Pt's NDI score will improved to the MCID value of 20% as indication of improved function  Baseline: 30% 08/26/22: 4% Goal status: MET     PLAN:   PT FREQUENCY: 2x/week   PT DURATION: 6 weeks   PLANNED INTERVENTIONS: Therapeutic exercises, Therapeutic activity, Self Care, Aquatic Therapy, Dry Needling, Electrical stimulation, Spinal manipulation, Spinal mobilization, Cryotherapy, Moist heat, Taping, Traction, Ionotophoresis 27m/ml Dexamethasone, Manual therapy, and Re-evaluation   PLAN FOR NEXT SESSION:  DC to HEP  today   Hessie Diener, PTA 08/26/22 3:06 PM Phone: [phone] Fax: [phone]   PHYSICAL THERAPY DISCHARGE SUMMARY  Visits from Start of Care: 7  Current functional level related to goals / functional outcomes: See clinical impression and PT goals    Remaining deficits: See clinical impression and PT goals    Education / Equipment: HEP   Patient agrees to discharge. Patient goals were met. Patient is being discharged due to being pleased with the current functional level.  Allen Ralls MS, PT 10/01/22 2:15 PM

## 2022-08-27 ENCOUNTER — Ambulatory Visit: Payer: Medicaid Other

## 2022-09-02 ENCOUNTER — Encounter: Payer: Medicaid Other | Admitting: Physical Therapy

## 2022-10-24 ENCOUNTER — Other Ambulatory Visit: Payer: Self-pay | Admitting: Podiatry

## 2022-10-28 ENCOUNTER — Encounter: Payer: Self-pay | Admitting: Podiatry

## 2022-10-28 ENCOUNTER — Ambulatory Visit (INDEPENDENT_AMBULATORY_CARE_PROVIDER_SITE_OTHER): Payer: Medicaid Other | Admitting: Podiatry

## 2022-10-28 DIAGNOSIS — M722 Plantar fascial fibromatosis: Secondary | ICD-10-CM | POA: Diagnosis not present

## 2022-10-28 MED ORDER — DICLOFENAC SODIUM 75 MG PO TBEC: 75.0000 mg | DELAYED_RELEASE_TABLET | Freq: Two times a day (BID) | ORAL | 2 refills | Status: AC

## 2022-10-28 MED ORDER — TRIAMCINOLONE ACETONIDE 10 MG/ML IJ SUSP
20.0000 mg | Freq: Once | INTRAMUSCULAR | Status: AC
  Administered 2022-10-28: 20 mg

## 2022-10-28 NOTE — Progress Notes (Signed)
Subjective:   Patient ID: Nancy Swanson, female   DOB: 37 y.o.   MRN: [id number]   HPI Patient presents with heel pain which is reoccurred stating that she had good relief for about 3-1/2 months and was satisfied and does not need new oral medicine and will see her doctor for blood work in the next several months   ROS      Objective:  Physical Exam  Neuro vas status intact with acute inflammation plantar heel region bilateral fluid buildup around the medial band     Assessment:  Acute plantar fasciitis bilateral     Plan:  Sterile prep injected the plantar fascia bilateral 3 mg Kenalog 5 mg Xylocaine bilateral and wrote her another prescription for diclofenac to take at this time

## 2023-02-24 ENCOUNTER — Ambulatory Visit: Payer: Medicaid Other | Admitting: Podiatry

## 2023-02-24 DIAGNOSIS — M722 Plantar fascial fibromatosis: Secondary | ICD-10-CM

## 2023-02-24 NOTE — Progress Notes (Signed)
Subjective:   Patient ID: Nancy Swanson, female   DOB: 37 y.o.   MRN: [id number]   HPI Patient presents with intense discomfort heel region left over right states that it is bad when she gets up in the morning after periods of sitting   ROS      Objective:  Physical Exam  Neurovascular status intact acute discomfort medial fascial band left over right with inflammation     Assessment:  Acute Planter fasciitis left over right     Plan:  H&P due to the pain she is experiencing and the pattern I did dispense a night splint and I did go ahead today and I injected the plantar fascia bilateral 3 mg Kenalog 5 mg Xylocaine to reduce the inflammatory process will be seen back as needed with instructions on night splint given to patient
